# Patient Record
Sex: Male | Born: 1960 | Race: Black or African American | Hispanic: No | Marital: Married | Smoking: Former smoker
Health system: Southern US, Community
[De-identification: ages and names within clinical notes are randomized; demographics above are authoritative.]

## PROBLEM LIST (undated history)

## (undated) DIAGNOSIS — E119 Type 2 diabetes mellitus without complications: Secondary | ICD-10-CM

## (undated) HISTORY — PX: CHOLECYSTECTOMY: SHX55

---

## 2015-04-09 ENCOUNTER — Other Ambulatory Visit (HOSPITAL_COMMUNITY): Payer: Self-pay | Admitting: Internal Medicine

## 2015-04-09 DIAGNOSIS — E291 Testicular hypofunction: Secondary | ICD-10-CM

## 2015-04-24 ENCOUNTER — Ambulatory Visit (HOSPITAL_COMMUNITY)
Admission: RE | Admit: 2015-04-24 | Discharge: 2015-04-24 | Disposition: A | Discharge status: Home / Self Care | Payer: Self-pay | Source: Ambulatory Visit | Attending: Internal Medicine | Admitting: Internal Medicine

## 2015-04-24 ENCOUNTER — Other Ambulatory Visit (HOSPITAL_COMMUNITY): Payer: Self-pay | Admitting: Internal Medicine

## 2015-04-24 DIAGNOSIS — E291 Testicular hypofunction: Secondary | ICD-10-CM

## 2015-04-24 DIAGNOSIS — Z0389 Encounter for observation for other suspected diseases and conditions ruled out: Secondary | ICD-10-CM | POA: Insufficient documentation

## 2015-04-29 ENCOUNTER — Ambulatory Visit (HOSPITAL_COMMUNITY)
Admission: RE | Admit: 2015-04-29 | Discharge: 2015-04-29 | Disposition: A | Discharge status: Home / Self Care | Payer: BLUE CROSS/BLUE SHIELD | Source: Ambulatory Visit | Attending: Internal Medicine | Admitting: Internal Medicine

## 2015-04-29 ENCOUNTER — Other Ambulatory Visit (HOSPITAL_COMMUNITY): Payer: Self-pay | Admitting: Internal Medicine

## 2015-04-29 DIAGNOSIS — E291 Testicular hypofunction: Secondary | ICD-10-CM

## 2015-04-29 DIAGNOSIS — Q048 Other specified congenital malformations of brain: Secondary | ICD-10-CM | POA: Diagnosis not present

## 2015-04-29 MED ORDER — GADOBENATE DIMEGLUMINE 529 MG/ML IV SOLN
20.0000 mL | Freq: Once | INTRAVENOUS | Status: AC | PRN
  Administered 2015-04-29: 20 mL via INTRAVENOUS

## 2021-09-11 ENCOUNTER — Encounter (HOSPITAL_COMMUNITY): Payer: Self-pay

## 2021-09-11 ENCOUNTER — Emergency Department (HOSPITAL_COMMUNITY): Payer: BC Managed Care – PPO | Admitting: Anesthesiology

## 2021-09-11 ENCOUNTER — Encounter (HOSPITAL_COMMUNITY)
Admission: EM | Disposition: A | Discharge status: Home / Self Care | Payer: Self-pay | Source: Home / Self Care | Attending: Surgery

## 2021-09-11 ENCOUNTER — Inpatient Hospital Stay (HOSPITAL_COMMUNITY)
Admission: EM | Admit: 2021-09-11 | Discharge: 2021-09-15 | DRG: 339 | Disposition: A | Discharge status: Home / Self Care | Payer: BC Managed Care – PPO | Attending: Surgery | Admitting: Surgery

## 2021-09-11 ENCOUNTER — Other Ambulatory Visit: Payer: Self-pay

## 2021-09-11 ENCOUNTER — Emergency Department (HOSPITAL_COMMUNITY): Payer: BC Managed Care – PPO

## 2021-09-11 DIAGNOSIS — D649 Anemia, unspecified: Secondary | ICD-10-CM

## 2021-09-11 DIAGNOSIS — R17 Unspecified jaundice: Secondary | ICD-10-CM | POA: Diagnosis present

## 2021-09-11 DIAGNOSIS — D72829 Elevated white blood cell count, unspecified: Secondary | ICD-10-CM | POA: Diagnosis not present

## 2021-09-11 DIAGNOSIS — E876 Hypokalemia: Secondary | ICD-10-CM | POA: Diagnosis present

## 2021-09-11 DIAGNOSIS — Z833 Family history of diabetes mellitus: Secondary | ICD-10-CM

## 2021-09-11 DIAGNOSIS — E871 Hypo-osmolality and hyponatremia: Secondary | ICD-10-CM | POA: Diagnosis present

## 2021-09-11 DIAGNOSIS — Z20822 Contact with and (suspected) exposure to covid-19: Secondary | ICD-10-CM | POA: Diagnosis present

## 2021-09-11 DIAGNOSIS — E0800 Diabetes mellitus due to underlying condition with hyperosmolarity without nonketotic hyperglycemic-hyperosmolar coma (NKHHC): Principal | ICD-10-CM

## 2021-09-11 DIAGNOSIS — D62 Acute posthemorrhagic anemia: Secondary | ICD-10-CM | POA: Diagnosis not present

## 2021-09-11 DIAGNOSIS — Z6841 Body Mass Index (BMI) 40.0 and over, adult: Secondary | ICD-10-CM | POA: Diagnosis not present

## 2021-09-11 DIAGNOSIS — E1165 Type 2 diabetes mellitus with hyperglycemia: Secondary | ICD-10-CM | POA: Diagnosis not present

## 2021-09-11 DIAGNOSIS — Z87891 Personal history of nicotine dependence: Secondary | ICD-10-CM

## 2021-09-11 DIAGNOSIS — K352 Acute appendicitis with generalized peritonitis, without abscess: Secondary | ICD-10-CM | POA: Diagnosis present

## 2021-09-11 DIAGNOSIS — E119 Type 2 diabetes mellitus without complications: Secondary | ICD-10-CM | POA: Diagnosis present

## 2021-09-11 DIAGNOSIS — K3532 Acute appendicitis with perforation and localized peritonitis, without abscess: Principal | ICD-10-CM | POA: Diagnosis present

## 2021-09-11 DIAGNOSIS — K3580 Unspecified acute appendicitis: Secondary | ICD-10-CM | POA: Diagnosis present

## 2021-09-11 DIAGNOSIS — Z7984 Long term (current) use of oral hypoglycemic drugs: Secondary | ICD-10-CM

## 2021-09-11 DIAGNOSIS — K5900 Constipation, unspecified: Secondary | ICD-10-CM | POA: Diagnosis present

## 2021-09-11 HISTORY — PX: LAPAROSCOPIC APPENDECTOMY: SHX408

## 2021-09-11 LAB — COMPREHENSIVE METABOLIC PANEL
ALT: 24 U/L (ref 0–44)
AST: 17 U/L (ref 15–41)
Albumin: 3.5 g/dL (ref 3.5–5.0)
Alkaline Phosphatase: 97 U/L (ref 38–126)
Anion gap: 12 (ref 5–15)
BUN: 14 mg/dL (ref 8–23)
CO2: 27 mmol/L (ref 22–32)
Calcium: 9.9 mg/dL (ref 8.9–10.3)
Chloride: 91 mmol/L — ABNORMAL LOW (ref 98–111)
Creatinine, Ser: 0.82 mg/dL (ref 0.61–1.24)
GFR, Estimated: >60 mL/min (ref >60)
Glucose, Bld: 237 mg/dL — ABNORMAL HIGH (ref 70–99)
Potassium: 4.1 mmol/L (ref 3.5–5.1)
Sodium: 130 mmol/L — ABNORMAL LOW (ref 135–145)
Total Bilirubin: 1.4 mg/dL — ABNORMAL HIGH (ref 0.3–1.2)
Total Protein: 8 g/dL (ref 6.5–8.1)

## 2021-09-11 LAB — CBC
HCT: 46.9 % (ref 39.0–52.0)
Hemoglobin: 15.8 g/dL (ref 13.0–17.0)
MCH: 29 pg (ref 26.0–34.0)
MCHC: 33.7 g/dL (ref 30.0–36.0)
MCV: 86.2 fL (ref 80.0–100.0)
Platelets: 257 10*3/uL (ref 150–400)
RBC: 5.44 MIL/uL (ref 4.22–5.81)
RDW: 12.6 % (ref 11.5–15.5)
WBC: 24.5 10*3/uL — ABNORMAL HIGH (ref 4.0–10.5)
nRBC: 0 % (ref 0.0–0.2)

## 2021-09-11 LAB — URINALYSIS, ROUTINE W REFLEX MICROSCOPIC
Bilirubin Urine: NEGATIVE
Glucose, UA: NEGATIVE mg/dL
Ketones, ur: 5 mg/dL — AB
Leukocytes,Ua: NEGATIVE
Nitrite: NEGATIVE
Protein, ur: 100 mg/dL — AB
Specific Gravity, Urine: >1.046 — ABNORMAL HIGH (ref 1.005–1.030)
pH: 5 (ref 5.0–8.0)

## 2021-09-11 LAB — RESP PANEL BY RT-PCR (FLU A&B, COVID) ARPGX2
Influenza A by PCR: NEGATIVE
Influenza B by PCR: NEGATIVE
SARS Coronavirus 2 by RT PCR: NEGATIVE

## 2021-09-11 LAB — GLUCOSE, CAPILLARY
Glucose-Capillary: 274 mg/dL — ABNORMAL HIGH (ref 70–99)
Glucose-Capillary: 225 mg/dL — ABNORMAL HIGH (ref 70–99)

## 2021-09-11 LAB — LIPASE, BLOOD: Lipase: 41 U/L (ref 11–51)

## 2021-09-11 SURGERY — APPENDECTOMY, LAPAROSCOPIC
Anesthesia: General | Site: Abdomen

## 2021-09-11 MED ORDER — ESMOLOL HCL 100 MG/10ML IV SOLN
INTRAVENOUS | Status: DC | PRN
  Administered 2021-09-11: 20 mg via INTRAVENOUS

## 2021-09-11 MED ORDER — ONDANSETRON HCL 4 MG/2ML IJ SOLN
INTRAMUSCULAR | Status: AC
  Filled 2021-09-11: qty 2

## 2021-09-11 MED ORDER — KETOROLAC TROMETHAMINE 30 MG/ML IJ SOLN
INTRAMUSCULAR | Status: DC | PRN
Start: 2021-09-11 — End: 2021-09-11
  Administered 2021-09-11: 30 mg via INTRAVENOUS

## 2021-09-11 MED ORDER — IOHEXOL 300 MG/ML  SOLN
100.0000 mL | Freq: Once | INTRAMUSCULAR | Status: AC | PRN
  Administered 2021-09-11: 100 mL via INTRAVENOUS

## 2021-09-11 MED ORDER — ONDANSETRON 4 MG PO TBDP: 4.0000 mg | ORAL_TABLET | Freq: Four times a day (QID) | ORAL | Status: DC | PRN

## 2021-09-11 MED ORDER — ACETAMINOPHEN 10 MG/ML IV SOLN
INTRAVENOUS | Status: DC | PRN
  Administered 2021-09-11: 1000 mg via INTRAVENOUS

## 2021-09-11 MED ORDER — CHLORHEXIDINE GLUCONATE CLOTH 2 % EX PADS: 6.0000 | MEDICATED_PAD | Freq: Once | CUTANEOUS | Status: DC

## 2021-09-11 MED ORDER — MIDAZOLAM HCL 2 MG/2ML IJ SOLN
INTRAMUSCULAR | Status: DC | PRN
Start: 2021-09-11 — End: 2021-09-11
  Administered 2021-09-11: 2 mg via INTRAVENOUS

## 2021-09-11 MED ORDER — PROPOFOL 10 MG/ML IV BOLUS
INTRAVENOUS | Status: AC
  Filled 2021-09-11: qty 20

## 2021-09-11 MED ORDER — MIDAZOLAM HCL 2 MG/2ML IJ SOLN
INTRAMUSCULAR | Status: AC
  Filled 2021-09-11: qty 2

## 2021-09-11 MED ORDER — ACETAMINOPHEN 500 MG PO TABS
1000.0000 mg | ORAL_TABLET | Freq: Four times a day (QID) | ORAL | Status: DC
  Administered 2021-09-11 – 2021-09-15 (×16): 1000 mg via ORAL
  Filled 2021-09-11 (×16): qty 2

## 2021-09-11 MED ORDER — KETOROLAC TROMETHAMINE 30 MG/ML IJ SOLN
INTRAMUSCULAR | Status: AC
  Filled 2021-09-11: qty 1

## 2021-09-11 MED ORDER — HYDROMORPHONE HCL 1 MG/ML IJ SOLN
0.5000 mg | INTRAMUSCULAR | Status: DC | PRN
  Administered 2021-09-12 (×2): 0.5 mg via INTRAVENOUS
  Filled 2021-09-11 (×2): qty 0.5

## 2021-09-11 MED ORDER — MORPHINE SULFATE (PF) 4 MG/ML IV SOLN
4.0000 mg | Freq: Once | INTRAVENOUS | Status: AC
  Administered 2021-09-11: 4 mg via INTRAVENOUS
  Filled 2021-09-11: qty 1

## 2021-09-11 MED ORDER — HYDROMORPHONE HCL 1 MG/ML IJ SOLN: 0.2500 mg | INTRAMUSCULAR | Status: DC | PRN

## 2021-09-11 MED ORDER — ACETAMINOPHEN 10 MG/ML IV SOLN
INTRAVENOUS | Status: AC
  Filled 2021-09-11: qty 100

## 2021-09-11 MED ORDER — ROCURONIUM BROMIDE 10 MG/ML (PF) SYRINGE
PREFILLED_SYRINGE | INTRAVENOUS | Status: AC
  Filled 2021-09-11: qty 10

## 2021-09-11 MED ORDER — ONDANSETRON HCL 4 MG/2ML IJ SOLN: 4.0000 mg | Freq: Four times a day (QID) | INTRAMUSCULAR | Status: DC | PRN

## 2021-09-11 MED ORDER — SUCCINYLCHOLINE CHLORIDE 200 MG/10ML IV SOSY
PREFILLED_SYRINGE | INTRAVENOUS | Status: AC
  Filled 2021-09-11: qty 10

## 2021-09-11 MED ORDER — FENTANYL CITRATE (PF) 250 MCG/5ML IJ SOLN
INTRAMUSCULAR | Status: DC | PRN
Start: 2021-09-11 — End: 2021-09-11
  Administered 2021-09-11 (×2): 100 ug via INTRAVENOUS
  Administered 2021-09-11: 50 ug via INTRAVENOUS

## 2021-09-11 MED ORDER — ONDANSETRON HCL 4 MG/2ML IJ SOLN
INTRAMUSCULAR | Status: DC | PRN
Start: 2021-09-11 — End: 2021-09-11
  Administered 2021-09-11: 4 mg via INTRAVENOUS

## 2021-09-11 MED ORDER — SODIUM CHLORIDE 0.9 % IV BOLUS
1000.0000 mL | Freq: Once | INTRAVENOUS | Status: AC
  Administered 2021-09-11: 1000 mL via INTRAVENOUS

## 2021-09-11 MED ORDER — PIPERACILLIN-TAZOBACTAM 3.375 G IVPB 30 MIN
3.3750 g | Freq: Once | INTRAVENOUS | Status: AC
  Administered 2021-09-11: 3.375 g via INTRAVENOUS
  Filled 2021-09-11: qty 50

## 2021-09-11 MED ORDER — INSULIN ASPART 100 UNIT/ML IJ SOLN
0.0000 [IU] | Freq: Three times a day (TID) | INTRAMUSCULAR | Status: DC
  Administered 2021-09-11: 8 [IU] via SUBCUTANEOUS
  Administered 2021-09-12 (×2): 2 [IU] via SUBCUTANEOUS
  Administered 2021-09-12: 3 [IU] via SUBCUTANEOUS
  Administered 2021-09-13: 2 [IU] via SUBCUTANEOUS
  Administered 2021-09-13: 3 [IU] via SUBCUTANEOUS
  Administered 2021-09-13: 2 [IU] via SUBCUTANEOUS
  Administered 2021-09-14 (×2): 3 [IU] via SUBCUTANEOUS
  Administered 2021-09-15 (×2): 2 [IU] via SUBCUTANEOUS

## 2021-09-11 MED ORDER — BUPIVACAINE LIPOSOME 1.3 % IJ SUSP
INTRAMUSCULAR | Status: AC
  Filled 2021-09-11: qty 20

## 2021-09-11 MED ORDER — METOCLOPRAMIDE HCL 5 MG/ML IJ SOLN
INTRAMUSCULAR | Status: DC | PRN
  Administered 2021-09-11: 10 mg via INTRAVENOUS

## 2021-09-11 MED ORDER — ESMOLOL HCL 100 MG/10ML IV SOLN
INTRAVENOUS | Status: AC
  Filled 2021-09-11: qty 10

## 2021-09-11 MED ORDER — ROCURONIUM BROMIDE 10 MG/ML (PF) SYRINGE
PREFILLED_SYRINGE | INTRAVENOUS | Status: DC | PRN
Start: 2021-09-11 — End: 2021-09-11
  Administered 2021-09-11: 20 mg via INTRAVENOUS
  Administered 2021-09-11: 100 mg via INTRAVENOUS

## 2021-09-11 MED ORDER — CHLORHEXIDINE GLUCONATE 0.12 % MT SOLN
15.0000 mL | Freq: Once | OROMUCOSAL | Status: AC
  Administered 2021-09-11: 15 mL via OROMUCOSAL

## 2021-09-11 MED ORDER — DEXAMETHASONE SODIUM PHOSPHATE 10 MG/ML IJ SOLN
INTRAMUSCULAR | Status: AC
  Filled 2021-09-11: qty 1

## 2021-09-11 MED ORDER — LIDOCAINE HCL (PF) 2 % IJ SOLN
INTRAMUSCULAR | Status: AC
  Filled 2021-09-11: qty 5

## 2021-09-11 MED ORDER — SUCCINYLCHOLINE CHLORIDE 200 MG/10ML IV SOSY
PREFILLED_SYRINGE | INTRAVENOUS | Status: DC | PRN
Start: 2021-09-11 — End: 2021-09-11
  Administered 2021-09-11: 120 mg via INTRAVENOUS

## 2021-09-11 MED ORDER — DEXAMETHASONE SODIUM PHOSPHATE 4 MG/ML IJ SOLN
INTRAMUSCULAR | Status: DC | PRN
Start: 2021-09-11 — End: 2021-09-11
  Administered 2021-09-11 (×2): 5 mg via INTRAVENOUS

## 2021-09-11 MED ORDER — PROPOFOL 10 MG/ML IV BOLUS
INTRAVENOUS | Status: DC | PRN
  Administered 2021-09-11: 200 mg via INTRAVENOUS
  Administered 2021-09-11: 30 mg via INTRAVENOUS

## 2021-09-11 MED ORDER — LACTATED RINGERS IV SOLN: INTRAVENOUS | Status: DC

## 2021-09-11 MED ORDER — OXYCODONE HCL 5 MG PO TABS: 5.0000 mg | ORAL_TABLET | ORAL | Status: DC | PRN

## 2021-09-11 MED ORDER — ENOXAPARIN SODIUM 40 MG/0.4ML IJ SOSY
40.0000 mg | PREFILLED_SYRINGE | INTRAMUSCULAR | Status: DC
  Administered 2021-09-11 – 2021-09-14 (×4): 40 mg via SUBCUTANEOUS
  Filled 2021-09-11 (×4): qty 0.4

## 2021-09-11 MED ORDER — PIPERACILLIN-TAZOBACTAM 3.375 G IVPB
3.3750 g | Freq: Three times a day (TID) | INTRAVENOUS | Status: DC
  Administered 2021-09-11 – 2021-09-15 (×12): 3.375 g via INTRAVENOUS
  Filled 2021-09-11 (×11): qty 50

## 2021-09-11 MED ORDER — SODIUM CHLORIDE 0.9 % IV SOLN: INTRAVENOUS | Status: DC

## 2021-09-11 MED ORDER — SODIUM CHLORIDE 0.9 % IR SOLN
Status: DC | PRN
  Administered 2021-09-11: 3000 mL

## 2021-09-11 MED ORDER — PROPOFOL 10 MG/ML IV BOLUS
INTRAVENOUS | Status: AC
Start: 2021-09-11 — End: ?
  Filled 2021-09-11: qty 20

## 2021-09-11 MED ORDER — METOCLOPRAMIDE HCL 5 MG/ML IJ SOLN
INTRAMUSCULAR | Status: AC
  Filled 2021-09-11: qty 2

## 2021-09-11 MED ORDER — ONDANSETRON HCL 4 MG/2ML IJ SOLN
4.0000 mg | Freq: Once | INTRAMUSCULAR | Status: AC
  Administered 2021-09-11: 4 mg via INTRAVENOUS
  Filled 2021-09-11: qty 2

## 2021-09-11 MED ORDER — SUGAMMADEX SODIUM 200 MG/2ML IV SOLN
INTRAVENOUS | Status: DC | PRN
  Administered 2021-09-11: 400 mg via INTRAVENOUS

## 2021-09-11 MED ORDER — SODIUM CHLORIDE 0.9 % IR SOLN
Status: DC | PRN
  Administered 2021-09-11: 1000 mL

## 2021-09-11 MED ORDER — PHENYLEPHRINE HCL (PRESSORS) 10 MG/ML IV SOLN
INTRAVENOUS | Status: DC | PRN
  Administered 2021-09-11: 40 ug via INTRAVENOUS
  Administered 2021-09-11: 200 ug via INTRAVENOUS
  Administered 2021-09-11: 160 ug via INTRAVENOUS
  Administered 2021-09-11 (×2): 200 ug via INTRAVENOUS

## 2021-09-11 MED ORDER — ORAL CARE MOUTH RINSE: 15.0000 mL | Freq: Once | OROMUCOSAL | Status: AC

## 2021-09-11 MED ORDER — MEPERIDINE HCL 50 MG/ML IJ SOLN: 6.2500 mg | INTRAMUSCULAR | Status: DC | PRN

## 2021-09-11 MED ORDER — BUPIVACAINE LIPOSOME 1.3 % IJ SUSP
INTRAMUSCULAR | Status: DC | PRN
  Administered 2021-09-11: 20 mL

## 2021-09-11 MED ORDER — FENTANYL CITRATE (PF) 250 MCG/5ML IJ SOLN
INTRAMUSCULAR | Status: AC
  Filled 2021-09-11: qty 5

## 2021-09-11 MED ORDER — LIDOCAINE 2% (20 MG/ML) 5 ML SYRINGE
INTRAMUSCULAR | Status: DC | PRN
  Administered 2021-09-11: 100 mg via INTRAVENOUS

## 2021-09-11 SURGICAL SUPPLY — 52 items
BAG RETRIEVAL 10 (BASKET) ×1
BLADE SURG 15 STRL LF DISP TIS (BLADE) ×1 IMPLANT
BLADE SURG 15 STRL SS (BLADE) ×1
CHLORAPREP W/TINT 26 (MISCELLANEOUS) ×2 IMPLANT
CLOTH BEACON ORANGE TIMEOUT ST (SAFETY) ×2 IMPLANT
COVER LIGHT HANDLE STERIS (MISCELLANEOUS) ×4 IMPLANT
CUTTER FLEX LINEAR 45M (STAPLE) ×2 IMPLANT
DERMABOND ADVANCED (GAUZE/BANDAGES/DRESSINGS) ×1
DERMABOND ADVANCED .7 DNX12 (GAUZE/BANDAGES/DRESSINGS) ×1 IMPLANT
ELECT REM PT RETURN 9FT ADLT (ELECTROSURGICAL) ×2
ELECTRODE REM PT RTRN 9FT ADLT (ELECTROSURGICAL) ×1 IMPLANT
GAUZE 4X4 16PLY ~~LOC~~+RFID DBL (SPONGE) ×1 IMPLANT
GLOVE SURG ENC MOIS LTX SZ6.5 (GLOVE) ×2 IMPLANT
GLOVE SURG LTX SZ6.5 (GLOVE) ×2 IMPLANT
GLOVE SURG POLYISO LF SZ7 (GLOVE) ×1 IMPLANT
GLOVE SURG UNDER POLY LF SZ6.5 (GLOVE) ×1 IMPLANT
GLOVE SURG UNDER POLY LF SZ7 (GLOVE) ×8 IMPLANT
GOWN STRL REUS W/ TWL LRG LVL3 (GOWN DISPOSABLE) IMPLANT
GOWN STRL REUS W/TWL LRG LVL3 (GOWN DISPOSABLE) ×5 IMPLANT
GRASPER SUT TROCAR 14GX15 (MISCELLANEOUS) ×1 IMPLANT
INST SET LAPROSCOPIC AP (KITS) ×2 IMPLANT
IV NS IRRIG 3000ML ARTHROMATIC (IV SOLUTION) ×1 IMPLANT
KIT TURNOVER KIT A (KITS) ×2 IMPLANT
MANIFOLD NEPTUNE II (INSTRUMENTS) ×2 IMPLANT
NDL HYPO 21X1.5 SAFETY (NEEDLE) ×1 IMPLANT
NDL INSUFFLATION 14GA 120MM (NEEDLE) ×1 IMPLANT
NEEDLE HYPO 21X1.5 SAFETY (NEEDLE) ×2 IMPLANT
NEEDLE INSUFFLATION 14GA 120MM (NEEDLE) ×2 IMPLANT
NS IRRIG 1000ML POUR BTL (IV SOLUTION) ×2 IMPLANT
PACK LAP CHOLE LZT030E (CUSTOM PROCEDURE TRAY) ×2 IMPLANT
PAD ARMBOARD 7.5X6 YLW CONV (MISCELLANEOUS) ×2 IMPLANT
PENCIL HANDSWITCHING (ELECTRODE) ×1 IMPLANT
RELOAD STAPLE 45 3.5 BLU ETS (ENDOMECHANICALS) IMPLANT
RELOAD STAPLE TA45 3.5 REG BLU (ENDOMECHANICALS) ×2 IMPLANT
SET BASIN LINEN APH (SET/KITS/TRAYS/PACK) ×2 IMPLANT
SET TUBE IRRIG SUCTION NO TIP (IRRIGATION / IRRIGATOR) ×1 IMPLANT
SET TUBE SMOKE EVAC HIGH FLOW (TUBING) ×2 IMPLANT
SHEARS HARMONIC ACE PLUS 36CM (ENDOMECHANICALS) ×2 IMPLANT
SLEEVE Z-THREAD 5X100MM (TROCAR) ×1 IMPLANT
SUT MNCRL AB 4-0 PS2 18 (SUTURE) ×4 IMPLANT
SUT VICRYL 0 UR6 27IN ABS (SUTURE) ×3 IMPLANT
SYR 20ML LL LF (SYRINGE) ×3 IMPLANT
SYS BAG RETRIEVAL 10MM (BASKET) ×1
SYSTEM BAG RETRIEVAL 10MM (BASKET) ×1 IMPLANT
TRAY FOLEY W/BAG SLVR 16FR (SET/KITS/TRAYS/PACK) ×1
TRAY FOLEY W/BAG SLVR 16FR ST (SET/KITS/TRAYS/PACK) ×1 IMPLANT
TROCAR ENDO BLADELESS 11MM (ENDOMECHANICALS) ×2 IMPLANT
TROCAR ENDO BLADELESS 12MM (ENDOMECHANICALS) ×2 IMPLANT
TROCAR XCEL UNIV SLVE 11M 100M (ENDOMECHANICALS) ×1 IMPLANT
TROCAR Z-THREAD FIOS 5X100MM (TROCAR) ×1 IMPLANT
TUBE CONNECTING 12X1/4 (SUCTIONS) ×2 IMPLANT
WARMER LAPAROSCOPE (MISCELLANEOUS) ×2 IMPLANT

## 2021-09-11 NOTE — ED Notes (Signed)
Patient transported to OR.

## 2021-09-11 NOTE — ED Provider Notes (Signed)
?[facility] DEPART[facility] ?Provider Note ? ? ?CSN: [id number] ?Arrival date & time: 09/11/21  0932 ? ?  ? ?History ? ?Chief Complaint  ?Patient presents with  ? Abdominal Pain  ? ? ?Angel Berry is a 61 y.o. male. ? ?HPI ?Patient presents with his wife who assists with the history.  Patient has difficulty with hearing.  He notes that he was generally well until yesterday when he developed diffuse abdominal pain across the lower abdomen.  Since that time he has had frequency of stool, episodes of vomiting, and no relief with OTC medication.  He has a history of prior cholecystectomy, but still has his appendix.  He has no history of kidney stones. ?  ? ?Home Medications ?Prior to Admission medications   ?Not on File  ?   ? ?Allergies    ?Patient has no known allergies.   ? ?Review of Systems   ?Review of Systems  ?Constitutional:   ?     Per HPI, otherwise negative  ?HENT:    ?     Per HPI, otherwise negative  ?Respiratory:    ?     Per HPI, otherwise negative  ?Cardiovascular:   ?     Per HPI, otherwise negative  ?Gastrointestinal:  Positive for abdominal pain, diarrhea, nausea and vomiting.  ?Endocrine:  ?     Negative aside from HPI  ?Genitourinary:   ?     Neg aside from HPI   ?Musculoskeletal:   ?     Per HPI, otherwise negative  ?Skin: Negative.   ?Neurological:  Negative for syncope.  ? ?Physical Exam ?Updated Vital Signs ?BP 136/74   Pulse (!) 101   Resp 20   Ht 5\' 7"  (1.702 m)   Wt 120.2 kg   SpO2 97%   BMI 41.50 kg/m?  ?Physical Exam ?Vitals and nursing note reviewed.  ?Constitutional:   ?   General: He is not in acute distress. ?   Appearance: He is well-developed. He is obese.  ?HENT:  ?   Head: Normocephalic and atraumatic.  ?Eyes:  ?   Conjunctiva/sclera: Conjunctivae normal.  ?Cardiovascular:  ?   Rate and Rhythm: Regular rhythm. Tachycardia present.  ?Pulmonary:  ?   Effort: Pulmonary effort is normal. No respiratory distress.  ?   Breath sounds: No stridor.  ?Abdominal:  ?   General:  There is no distension.  ?   Tenderness: There is abdominal tenderness in the right lower quadrant, suprapubic area and left lower quadrant. There is guarding.  ?Skin: ?   General: Skin is warm and dry.  ?Neurological:  ?   Mental Status: He is alert and oriented to person, place, and time.  ? ? ?ED Results / Procedures / Treatments   ?Labs ?(all labs ordered are listed, but only abnormal results are displayed) ?Labs Reviewed  ?CBC - Abnormal; Notable for the following components:  ?    Result Value  ? WBC 24.5 (*)   ? All other components within normal limits  ?LIPASE, BLOOD  ?COMPREHENSIVE METABOLIC PANEL  ?URINALYSIS, ROUTINE W REFLEX MICROSCOPIC  ? ? ?EKG ?None ? ?Radiology ?No results found. ? ?Procedures ?Procedures  ? ? ?Medications Ordered in ED ?Medications  ?ondansetron (ZOFRAN) injection 4 mg (has no administration in time range)  ?morphine (PF) 4 MG/ML injection 4 mg (has no administration in time range)  ?sodium chloride 0.9 % bolus 1,000 mL (has no administration in time range)  ?  And  ?0.9 %  sodium chloride infusion (has no administration in time range)  ? ? ?ED Course/ Medical Decision Making/ A&P ?This patient with a Hx of prior cholecystectomy presents to the ED for concern of abdominal pain, nausea, vomiting, diarrhea, this involves an extensive number of treatment options, and is a complaint that carries with it a high risk of complications and morbidity.   ? ?The differential diagnosis includes diverticulitis, intra-abdominal abscesses, appendicitis, bacteremia, sepsis, less likely kidney stone ? ? ?Social Determinants of Health: ? ?Obesity, age ? ?Additional history obtained: ? ?Additional history and/or information obtained from wife, notable for HPI details as above ? ? ?After the initial evaluation, orders, including: Labs, CT, IV narcotic analgesia, antiemetics were initiated. ? ? ?Patient placed on Cardiac and Pulse-Oximetry Monitors. ?The patient was maintained on a cardiac monitor.   The cardiac monitored showed an rhythm of sinus tach 105 abnormal ?The patient was also maintained on pulse oximetry. The readings were typically 100% room air normal ? ? ?On repeat evaluation of the patient stayed the same ? ?Lab Tests: ? ?I personally interpreted labs.  The pertinent results include: Substantial leukocytosis greater than 20,000 ? ?Imaging Studies ordered: ? ?I independently visualized and interpreted imaging which showed appendicitis, substantial surrounding inflammation, questionable perforation ?I agree with the radiologist interpretation ? ?Consultations Obtained: ? ?I requested consultation with the general surgery,  and discussed lab and imaging findings as well as pertinent plan - they recommend: IV antibiotics, admission for appendectomy ? ?Dispostion / Final MDM: ? ?After consideration of the diagnostic results and the patient's response to treatment, patient will be admitted.  This adult male presents with almost 2 days of abdominal pain, nausea, vomiting, has peritonitis on physical exam, there is immediate concern for intra-abdominal processes such as appendicitis versus diverticulitis plus minus abscess.  Patient CT scan demonstrated acute appendicitis, with possible rupture.  Case discussed at bedside with our general surgeon, patient admitted after initiation of antibiotics as above. ? ?Final Clinical Impression(s) / ED Diagnoses ?Final diagnoses:  ?Acute appendicitis with generalized peritonitis, without gangrene or abscess, unspecified whether perforation present  ? ?  ?Gerhard Munch, MD ?09/11/21 1251 ? ?

## 2021-09-11 NOTE — ED Triage Notes (Signed)
Patient with complaints of abdominal pain, with nausea, vomiting , and diarrhea for several days.  ?

## 2021-09-11 NOTE — Op Note (Signed)
[facility] ? ?Date of Surgery: 09/11/2021  ?Admit Date: 09/11/2021  ? ?Performing Service: General  ?Surgeon(s) and Role: ?   * Hannahmarie Asberry, Gustavus Messing, DO - Primary  ? ?Pre-operative Diagnosis: Acute Appendicitis ? ?Post-operative Diagnosis: Acute gangrenous and perforated appendicitis ? ?Procedure Performed: Laparoscopic Appendectomy  ? ?Surgeon: Theophilus Kinds, DO  ? ?Assistant: No qualified resident was available.  ? ?Anesthesia: General  ? ?Findings:  ?The appendix was found to be inflamed. There were signs of necrosis. There was perforation. There was not abscess formation.  ? ?Estimated Blood Loss: Minimal  ? ?Specimens:  ?ID Type Source Tests Collected by Time Destination  ?1 : Appendix Tissue PATH Appendix SURGICAL PATHOLOGY [name], Gustavus Messing, DO 09/11/2021 1447   ?  ? ?Complications: None; patient tolerated the procedure well.  ? ?Disposition: PACU - hemodynamically stable.  ? ?Condition: stable  ? ?Indications: The patient presented with a 2 day history of right-sided abdominal pain. A CT abdomen and pelvis revealed findings consistent with acute appendicitis, possible perforation.  Decision was made to take the patient to the operating room. ? ?All risks, benefits, and alternatives to Laparoscopic appendectomy were discussed with the patient and his family, all of their questions were answered to their expressed satisfaction. The patient expresses he wishes to proceed, and informed consent was obtained. ?  ? ?Procedure Details  ?Prior to the procedure, the risks, benefits, complications, treatment options, and expected outcomes were discussed with the patient and/or family, including but not limited to the risk of bleeding, infection, finding of a normal appendix, and the need for conversion to an open procedure. There was concurrence with the proposed plan and informed consent was obtained. The patient was taken to the operating room, identified as Angel Berry and the  procedure verified as Laproscopic Appendectomy.  ? ?The patient was placed in the supine position and general anesthesia was induced, along with placement of orogastric tube, SCD's, and a Foley catheter. The abdomen was prepped and draped in a sterile fashion. The abdomen was entered with Veress needle, but patient had high pressures with insufflation, that decision was made to enter the abdomen via Hassan technique.  The fascia and peritoneum were raised and incised, and the abdomen was entered. An 11 mm optiview trocar was placed under direct visualization with a 0 degree scope. The 10 mm 0 degree scope was placed in the abdomen and no evidence of injury was identified. A 12 mm port was placed in the left lower quadrant of the abdomen after skin incision with trocar placement under direct vision. A careful evaluation of the entire abdomen was carried out. An additional 5 mm port was placed in the suprapubic area under direct vision.  The patient was placed in Trendelenburg and left lateral decubitus position. The small intestines were retracted in the cephalad and left lateral direction away from the pelvis and right lower quadrant. The patient was found to have a gangrenous appendix. There was evidence of perforation with murky fluid surrounding the appendix. ? ?The appendix was carefully dissected. An additional 5 mm trocar was placed in the RUQ to assist with retraction.  The cecum was dissected free from the abdominal wall by taking down the white line of Toldt. The mesoappendix was taken with the harmonic energy device. The appendix was divided at its base using another tan endo-GIA stapler. Minimal appendiceal stump was left in place. The appendix was placed within an Endocatch specimen bag. There was no evidence of bleeding, leakage, or complication  after division of the appendix. ? ?Any remaining blood or pus was suctioned out from the abdomen, hemostasis was confirmed. The abdomen was then irrigated with  warm saline. The endocatch bag was removed via the 11 mm port.  Given the thick abdominal wall, a transfascial stitch of 0 Vicryl in a figure of eight fashion was used to close the 12 mm port site. The abdomen was desufflated. The appendix was passed off the field as a specimen.  ? ?The 10 mm port site was closed with a 0 Vicryl suture. The trocar site skin wounds were closed using subcuticular 4-0 Monocryl suture and dermabond. The patient was then awakened from general anesthesia, extubated, and taken to PACU for recovery.  ? ?Instrument, sponge, and needle counts were correct at the conclusion of the case.  ? ?Theophilus Kinds, DO ?[facility] ?[address] ?[place], [place] [address] ?[phone]([id number]) ? ? ? ? ?

## 2021-09-11 NOTE — Anesthesia Preprocedure Evaluation (Signed)
Anesthesia Evaluation  ?Patient identified by MRN, date of birth, ID band ?Patient awake ? ? ? ?Reviewed: ?Allergy & Precautions, NPO status , Patient's Chart, lab work & pertinent test results ? ?Airway ?Mallampati: III ? ?TM Distance: >3 FB ?Neck ROM: Full ? ? ? Dental ? ?(+) Dental Advisory Given, Missing ?  ?Pulmonary ?former smoker,  ?  ?Pulmonary exam normal ?breath sounds clear to auscultation ? ? ? ? ? ? Cardiovascular ?negative cardio ROS ? ? ?Rhythm:Regular Rate:Tachycardia ? ? ?  ?Neuro/Psych ?negative neurological ROS ? negative psych ROS  ? GI/Hepatic ?Neg liver ROS, GERD (mild)  ,  ?Endo/Other  ?Morbid obesity ? Renal/GU ?negative Renal ROS  ?negative genitourinary ?  ?Musculoskeletal ?negative musculoskeletal ROS ?(+)  ? Abdominal ?  ?Peds ?negative pediatric ROS ?(+)  Hematology ?negative hematology ROS ?(+)   ?Anesthesia Other Findings ?Room air saturations 88-89% ? Reproductive/Obstetrics ?negative OB ROS ? ?  ? ? ? ? ? ? ? ? ? ? ? ? ? ?  ?  ? ? ? ? ? ? ? ?Anesthesia Physical ?Anesthesia Plan ? ?ASA: 3 and emergent ? ?Anesthesia Plan: General  ? ?Post-op Pain Management: Dilaudid IV  ? ?Induction: Intravenous ? ?PONV Risk Score and Plan: 4 or greater and Ondansetron and Dexamethasone ? ?Airway Management Planned: Oral ETT ? ?Additional Equipment:  ? ?Intra-op Plan:  ? ?Post-operative Plan: Extubation in OR ? ?Informed Consent: I have reviewed the patients History and Physical, chart, labs and discussed the procedure including the risks, benefits and alternatives for the proposed anesthesia with the patient or authorized representative who has indicated his/her understanding and acceptance.  ? ? ? ?Dental advisory given ? ?Plan Discussed with: CRNA and Surgeon ? ?Anesthesia Plan Comments:   ? ? ? ? ? ?Anesthesia Quick Evaluation ? ?

## 2021-09-11 NOTE — ED Notes (Signed)
Consent signed for appendectomy  ?

## 2021-09-11 NOTE — Anesthesia Procedure Notes (Signed)
Procedure Name: Intubation ?Date/Time: 09/11/2021 1:44 PM ?Performed by: Cy Blamer, CRNA ?Pre-anesthesia Checklist: Patient identified, Emergency Drugs available, Suction available, Patient being monitored and Timeout performed ?Patient Re-evaluated:Patient Re-evaluated prior to induction ?Oxygen Delivery Method: Circle system utilized ?Preoxygenation: Pre-oxygenation with 100% oxygen ?Induction Type: IV induction, Rapid sequence and Cricoid Pressure applied ?Laryngoscope Size: Glidescope and 3 ?Grade View: Grade I ?Tube type: Oral ?Tube size: 7.5 mm ?Number of attempts: 1 ?Airway Equipment and Method: Stylet, Oral airway and Video-laryngoscopy ?Placement Confirmation: ETT inserted through vocal cords under direct vision, positive ETCO2 and breath sounds checked- equal and bilateral ?Secured at: 23 cm ?Tube secured with: Tape ?Dental Injury: Teeth and Oropharynx as per pre-operative assessment  ? ? ? ? ?

## 2021-09-11 NOTE — Transfer of Care (Signed)
Immediate Anesthesia Transfer of Care Note ? ?Patient: Angel Berry ? ?Procedure(s) Performed: APPENDECTOMY LAPAROSCOPIC (Abdomen) ? ?Patient Location: PACU ? ?Anesthesia Type:General ? ?Level of Consciousness: awake, drowsy and patient cooperative ? ?Airway & Oxygen Therapy: Patient Spontanous Breathing and Patient connected to nasal cannula oxygen ? ?Post-op Assessment: Report given to RN, Post -op Vital signs reviewed and stable and Patient moving all extremities X 4 ? ?Post vital signs: Reviewed and stable ? ?Last Vitals:  ?Vitals Value Taken Time  ?BP    ?Temp    ?Pulse 102 09/11/21 1636  ?Resp 20 09/11/21 1637  ?SpO2 94 % 09/11/21 1636  ?Vitals shown include unvalidated device data. ? ?Last Pain:  ?Vitals:  ? 09/11/21 1302  ?TempSrc: Oral  ?PainSc: 5   ?   ? ?  ? ?Complications: No notable events documented. ?

## 2021-09-11 NOTE — H&P (Signed)
[facility] History and Physical ? ?Reason for Referral: Acute appendicitis ?Referring Physician: Gerhard Munchobert Lockwood, MD ? ?Chief Complaint   ?Abdominal Pain ?  ? ? ?Angel MountGregory Berry is a 61 y.o. male.  ?HPI: Patient presents for a 2 day history of abdominal pain.  He states that on Tuesday, he started having periumbilical pain.  He confirms feeling chilled with nausea and episodes of emesis yesterday.  His pain continued to progress yesterday, and then this morning, he started having sharp pains in the right lower quadrant, which prompted him to come to the emergency department.  He has never had pain like this before.  Patient denies any significant past medical history.  He only takes ibuprofen as needed for pain.  He denies use of blood thinning medications.  His surgical history significant for an open cholecystectomy in 1989.  He quit smoking in 1996, denies illicit drug use, and only intermittently drinks alcohol. ? ?In the ED, he underwent a CT abdomen and pelvis which demonstrates acute appendicitis with some free fluid noted, possibly concerning for perforation.  WBC 24.5. ? ?No past medical history on file. ? ?No family history on file. ? ?Social History  ? ?Tobacco Use  ? Smoking status: Former  ?  Types: Cigarettes  ?Substance Use Topics  ? Alcohol use: Yes  ? Drug use: Not Currently  ? ? ?Medications: I have reviewed the patient's current medications. ?Allergies as of 09/11/2021   ?No Known Allergies ?  ? ? ?ROS:  ?Constitutional: positive for chills and fatigue, negative for fevers ?Respiratory: negative for cough, wheezing, and shortness of breath ?Cardiovascular: negative for chest pain and palpitations ?Gastrointestinal: positive for abdominal pain, nausea, and vomiting ?Musculoskeletal:negative for back pain ? ?Blood pressure 118/66, pulse 96, temperature 97.9 ?F (36.6 ?C), temperature source Oral, resp. rate 16, height 5\' 7"  (1.702 m), weight 120.2 kg, SpO2 93 %. ?Physical  Exam ?Vitals reviewed.  ?Constitutional:   ?   Appearance: He is well-developed.  ?HENT:  ?   Head: Normocephalic and atraumatic.  ?Cardiovascular:  ?   Rate and Rhythm: Normal rate.  ?Pulmonary:  ?   Effort: Pulmonary effort is normal.  ?Abdominal:  ?   Comments: Abdomen soft, nondistended, no percussion tenderness, tenderness to palpation in bilateral lower quadrants, worse on the right; no rigidity, guarding, rebound tenderness; large cicatrix noted in right upper quadrant; positive McBurney's and Rovsing signs  ?Skin: ?   General: Skin is warm and dry.  ?Neurological:  ?   General: No focal deficit present.  ?   Mental Status: He is alert and oriented to person, place, and time.  ?Psychiatric:     ?   Mood and Affect: Mood normal.     ?   Behavior: Behavior normal.  ? ? ?Results: Imaging evaluated by myself ?Results for orders placed or performed during the hospital encounter of 09/11/21 (from the past 48 hour(s))  ?Lipase, blood     Status: None  ? Collection Time: 09/11/21  9:40 AM  ?Result Value Ref Range  ? Lipase 41 11 - 51 U/L  ?  Comment: Performed at [facility], [address] [place], [place] [address]  ?Comprehensive metabolic panel     Status: Abnormal  ? Collection Time: 09/11/21  9:40 AM  ?Result Value Ref Range  ? Sodium 130 (L) 135 - 145 mmol/L  ? Potassium 4.1 3.5 - 5.1 mmol/L  ? Chloride 91 (L) 98 - 111 mmol/L  ? CO2 27 22 - 32  mmol/L  ? Glucose, Bld 237 (H) 70 - 99 mg/dL  ?  Comment: Glucose reference range applies only to samples taken after fasting for at least 8 hours.  ? BUN 14 8 - 23 mg/dL  ? Creatinine, Ser 0.82 0.61 - 1.24 mg/dL  ? Calcium 9.9 8.9 - 10.3 mg/dL  ? Total Protein 8.0 6.5 - 8.1 g/dL  ? Albumin 3.5 3.5 - 5.0 g/dL  ? AST 17 15 - 41 U/L  ? ALT 24 0 - 44 U/L  ? Alkaline Phosphatase 97 38 - 126 U/L  ? Total Bilirubin 1.4 (H) 0.3 - 1.2 mg/dL  ? GFR, Estimated >60 >60 mL/min  ?  Comment: (NOTE) ?Calculated using the CKD-EPI Creatinine Equation (2021) ?  ? Anion gap 12 5 - 15  ?   Comment: Performed at [facility], [address], [place], [place] [address]  ?CBC     Status: Abnormal  ? Collection Time: 09/11/21  9:40 AM  ?Result Value Ref Range  ? WBC 24.5 (H) 4.0 - 10.5 K/uL  ? RBC 5.44 4.22 - 5.81 MIL/uL  ? Hemoglobin 15.8 13.0 - 17.0 g/dL  ? HCT 46.9 39.0 - 52.0 %  ? MCV 86.2 80.0 - 100.0 fL  ? MCH 29.0 26.0 - 34.0 pg  ? MCHC 33.7 30.0 - 36.0 g/dL  ? RDW 12.6 11.5 - 15.5 %  ? Platelets 257 150 - 400 K/uL  ? nRBC 0.0 0.0 - 0.2 %  ?  Comment: Performed at [facility], [address], [place], [place] [address]  ?Resp Panel by RT-PCR (Flu A&B, Covid) Nasopharyngeal Swab     Status: None  ? Collection Time: 09/11/21 10:20 AM  ? Specimen: Nasopharyngeal Swab; Nasopharyngeal(NP) swabs in vial transport medium  ?Result Value Ref Range  ? SARS Coronavirus 2 by RT PCR NEGATIVE NEGATIVE  ?  Comment: (NOTE) ?SARS-CoV-2 target nucleic acids are NOT DETECTED. ? ?The SARS-CoV-2 RNA is generally detectable in upper respiratory ?specimens during the acute phase of infection. The lowest ?concentration of SARS-CoV-2 viral copies this assay can detect is ?138 copies/mL. A negative result does not preclude SARS-Cov-2 ?infection and should not be used as the sole basis for treatment or ?other patient management decisions. A negative result may occur with  ?improper specimen collection/handling, submission of specimen other ?than nasopharyngeal swab, presence of viral mutation(s) within the ?areas targeted by this assay, and inadequate number of viral ?copies(<138 copies/mL). A negative result must be combined with ?clinical observations, patient history, and epidemiological ?information. The expected result is Negative. ? ?Fact Sheet for Patients:  ?BloggerCourse.com ? ?Fact Sheet for Healthcare Providers:  ?SeriousBroker.it ? ?This test is no t yet approved or cleared by the [place] FDA and  ?has been authorized for detection and/or diagnosis of  SARS-CoV-2 by ?FDA under an Emergency Use Authorization (EUA). This EUA will remain  ?in effect (meaning this test can be used) for the duration of the ?COVID-19 declaration under Section 564(b)(1) of the Act, 21 ?U.S.C.section 360bbb-3(b)(1), unless the authorization is terminated  ?or revoked sooner.  ? ? ?  ? Influenza A by PCR NEGATIVE NEGATIVE  ? Influenza B by PCR NEGATIVE NEGATIVE  ?  Comment: (NOTE) ?The Xpert Xpress SARS-CoV-2/FLU/RSV plus assay is intended as an aid ?in the diagnosis of influenza from Nasopharyngeal swab specimens and ?should not be used as a sole basis for treatment. Nasal washings and ?aspirates are unacceptable for Xpert Xpress SARS-CoV-2/FLU/RSV ?testing. ? ?Fact Sheet for Patients: ?BloggerCourse.com ? ?  Fact Sheet for Healthcare Providers: ?SeriousBroker.it ? ?This test is not yet approved or cleared by the [place] FDA and ?has been authorized for detection and/or diagnosis of SARS-CoV-2 by ?FDA under an Emergency Use Authorization (EUA). This EUA will remain ?in effect (meaning this test can be used) for the duration of the ?COVID-19 declaration under Section 564(b)(1) of the Act, 21 U.S.C. ?section 360bbb-3(b)(1), unless the authorization is terminated or ?revoked. ? ?Performed at [facility], [address], [place], [place] [address] ?  ? ? ?CT ABDOMEN PELVIS W CONTRAST ? ?Addendum Date: 09/11/2021   ?ADDENDUM REPORT: 09/11/2021 12:28 ADDENDUM: These results were called by telephone at the time of interpretation on 09/11/2021 at 12:28 pm to provider Gerhard Munch , who verbally acknowledged these results. Electronically Signed   By: Donzetta Kohut M.D.   On: 09/11/2021 12:28  ? ?Result Date: 09/11/2021 ?CLINICAL DATA:  A 61 year old male presents with nausea, vomiting and abdominal pain. EXAM: CT ABDOMEN AND PELVIS WITH CONTRAST TECHNIQUE: Multidetector CT imaging of the abdomen and pelvis was performed using the standard  protocol following bolus administration of intravenous contrast. RADIATION DOSE REDUCTION: This exam was performed according to the departmental dose-optimization program which includes automated exposure control, adjust

## 2021-09-11 NOTE — Progress Notes (Signed)
Called to update the patient's wife, Angel Berry, after the surgery.  It was explained that we were able to remove his appendix without significant difficulty.  His appendix was necrotic and perforated.  I further explained that we cleaned out his abdomen as best we could.  He will be admitted to the floor and likely be in the hospital for at least 2 days.  I further explained that he will likely have an ileus postoperatively given the amount of inflammation in his abdomen.  We will trial him on clear liquid diets today to see how he tolerates this.  He will continue to receive his IV antibiotics.  All of her questions were answered to her expressed satisfaction. ? ?Plan: ?-CLD ?-We will consider advancement later tonight if patient tolerates clears without nausea and vomiting ?-Zosyn ?-IV fluids ?-Pain control and antiemetics as needed ?-AM labs ? ?Arlind Klingerman, DO ?[facility] ?[address] ?[place], [place] [address] ?[phone] (office) ? ?

## 2021-09-12 ENCOUNTER — Encounter (HOSPITAL_COMMUNITY): Payer: Self-pay | Admitting: Surgery

## 2021-09-12 DIAGNOSIS — E119 Type 2 diabetes mellitus without complications: Secondary | ICD-10-CM

## 2021-09-12 DIAGNOSIS — E1165 Type 2 diabetes mellitus with hyperglycemia: Secondary | ICD-10-CM

## 2021-09-12 DIAGNOSIS — E871 Hypo-osmolality and hyponatremia: Secondary | ICD-10-CM | POA: Diagnosis not present

## 2021-09-12 DIAGNOSIS — D72829 Elevated white blood cell count, unspecified: Secondary | ICD-10-CM

## 2021-09-12 DIAGNOSIS — K3532 Acute appendicitis with perforation and localized peritonitis, without abscess: Secondary | ICD-10-CM | POA: Diagnosis not present

## 2021-09-12 LAB — HIV ANTIBODY (ROUTINE TESTING W REFLEX): HIV Screen 4th Generation wRfx: NONREACTIVE

## 2021-09-12 LAB — CBC
HCT: 40.7 % (ref 39.0–52.0)
Hemoglobin: 13.3 g/dL (ref 13.0–17.0)
MCH: 29.8 pg (ref 26.0–34.0)
MCHC: 32.7 g/dL (ref 30.0–36.0)
MCV: 91.3 fL (ref 80.0–100.0)
Platelets: 207 10*3/uL (ref 150–400)
RBC: 4.46 MIL/uL (ref 4.22–5.81)
RDW: 13 % (ref 11.5–15.5)
WBC: 33.2 10*3/uL — ABNORMAL HIGH (ref 4.0–10.5)
nRBC: 0 % (ref 0.0–0.2)

## 2021-09-12 LAB — BASIC METABOLIC PANEL
Anion gap: 9 (ref 5–15)
BUN: 19 mg/dL (ref 8–23)
CO2: 25 mmol/L (ref 22–32)
Calcium: 9.2 mg/dL (ref 8.9–10.3)
Chloride: 100 mmol/L (ref 98–111)
Creatinine, Ser: 0.79 mg/dL (ref 0.61–1.24)
GFR, Estimated: >60 mL/min (ref >60)
Glucose, Bld: 163 mg/dL — ABNORMAL HIGH (ref 70–99)
Potassium: 3.7 mmol/L (ref 3.5–5.1)
Sodium: 134 mmol/L — ABNORMAL LOW (ref 135–145)

## 2021-09-12 LAB — HEMOGLOBIN A1C
Hgb A1c MFr Bld: 7.6 % — ABNORMAL HIGH (ref 4.8–5.6)
Mean Plasma Glucose: 171.42 mg/dL

## 2021-09-12 LAB — GLUCOSE, CAPILLARY
Glucose-Capillary: 152 mg/dL — ABNORMAL HIGH (ref 70–99)
Glucose-Capillary: 134 mg/dL — ABNORMAL HIGH (ref 70–99)
Glucose-Capillary: 130 mg/dL — ABNORMAL HIGH (ref 70–99)
Glucose-Capillary: 126 mg/dL — ABNORMAL HIGH (ref 70–99)

## 2021-09-12 MED ORDER — METFORMIN HCL 500 MG PO TABS: 500.0000 mg | ORAL_TABLET | Freq: Two times a day (BID) | ORAL | 1 refills | Status: DC

## 2021-09-12 MED ORDER — BLOOD GLUCOSE MONITOR KIT: PACK | 0 refills | Status: AC

## 2021-09-12 MED ORDER — METFORMIN HCL 500 MG PO TABS
500.0000 mg | ORAL_TABLET | Freq: Two times a day (BID) | ORAL | Status: DC
  Administered 2021-09-12 – 2021-09-15 (×6): 500 mg via ORAL
  Filled 2021-09-12 (×6): qty 1

## 2021-09-12 MED ORDER — LIVING WELL WITH DIABETES BOOK: Freq: Once | Status: AC

## 2021-09-12 NOTE — Assessment & Plan Note (Signed)
See above

## 2021-09-12 NOTE — Progress Notes (Signed)
[facility] Progress Note ? ?1 Day Post-Op  ?Subjective: ?Angel Berry is a 61 y.o. male s/p laparoscopic appendectomy. On morning rounds patient is resting comfortably and denies any abdominal pain but does endorse some soreness along his incision sites. He was able to tolerate clears yesterday without nausea or vomiting. He denies any fever or chills. He has not had any bowel movement but has been passing flatus. He has been able to ambulate to use the restroom without difficulty.  ? ?Objective: ?Vital signs in last 24 hours: ?Temp:  [97.7 ?F (36.5 ?C)-99.8 ?F (37.7 ?C)] 97.7 ?F (36.5 ?C) (03/24 8841) ?Pulse Rate:  [78-104] 78 (03/24 6606) ?Resp:  [16-24] 20 (03/24 3016) ?BP: (98-145)/(45-79) 145/70 (03/24 0109) ?SpO2:  [88 %-98 %] 97 % (03/24 3235) ?Weight:  [120.2 kg] 120.2 kg (03/23 5732) ?  ? ?Intake/Output from previous day: ?03/23 0701 - 03/24 0700 ?In: 4119.9 [I.V.:2869.8; IV Piggyback:1250.1] ?Out: 520 [Urine:100; Blood:20] ?Intake/Output this shift: ?No intake/output data recorded. ? ?General appearance: alert, cooperative, no distress, and morbidly obese ?Resp: clear to auscultation bilaterally ?Cardio: regular rate and rhythm, S1, S2 normal, no murmur, click, rub or gallop ?GI: soft, non-tender; bowel sounds normal; no masses,  no organomegaly and incision sites are clean, dry, and well healing.  ? ?Lab Results:  ?Recent Labs  ?  09/11/21 ?2025 09/12/21 ?4270  ?WBC 24.5* 33.2*  ?HGB 15.8 13.3  ?HCT 46.9 40.7  ?PLT 257 207  ? ?BMET ?Recent Labs  ?  09/11/21 ?6237 09/12/21 ?6283  ?NA 130* 134*  ?K 4.1 3.7  ?CL 91* 100  ?CO2 27 25  ?GLUCOSE 237* 163*  ?BUN 14 19  ?CREATININE 0.82 0.79  ?CALCIUM 9.9 9.2  ? ?PT/INR ?No results for input(s): LABPROT, INR in the last 72 hours. ? ?Studies/Results: ?CT ABDOMEN PELVIS W CONTRAST ? ?Addendum Date: 09/11/2021   ?ADDENDUM REPORT: 09/11/2021 12:28 ADDENDUM: These results were called by telephone at the time of interpretation on 09/11/2021 at 12:28  pm to provider Gerhard Munch , who verbally acknowledged these results. Electronically Signed   By: Donzetta Kohut M.D.   On: 09/11/2021 12:28  ? ?Result Date: 09/11/2021 ?CLINICAL DATA:  A 61 year old male presents with nausea, vomiting and abdominal pain. EXAM: CT ABDOMEN AND PELVIS WITH CONTRAST TECHNIQUE: Multidetector CT imaging of the abdomen and pelvis was performed using the standard protocol following bolus administration of intravenous contrast. RADIATION DOSE REDUCTION: This exam was performed according to the departmental dose-optimization program which includes automated exposure control, adjustment of the mA and/or kV according to patient size and/or use of iterative reconstruction technique. CONTRAST:  OMNIPAQUE IOHEXOL 300 MG/ML  SOLN COMPARISON:  None FINDINGS: Lower chest: Incidental imaging of the lung bases with signs of basilar atelectasis. No effusion. No consolidative changes. Hepatobiliary: Marked hepatic steatosis with lobular hepatic contours. Portal vein is patent. Post cholecystectomy without signs of biliary duct distension. Hepatic veins are patent. Pancreas: Normal, without mass, inflammation or ductal dilatation. Spleen: Normal. Adrenals/Urinary Tract: Adrenal glands are unremarkable. Symmetric renal enhancement. No sign of hydronephrosis. No suspicious renal lesion or perinephric stranding. Urinary bladder is grossly unremarkable. Cyst arises from the lower pole the RIGHT kidney measuring 3 cm. Stomach/Bowel: Signs of acute appendicitis with appendicolith at the base of the appendix and marked appendiceal dilation measuring 17 mm due to obstruction. Periappendiceal fluid without focal features or peripheral enhancement. Stranding in the adjacent small bowel mesentery and in the RIGHT lower quadrant in general with small amount of fluid also  tracking into the pelvis. No defect present in wall enhancement of the appendix but with mildly irregular appearance of the wall.  Secondary inflammation of the adjacent ileum. No free air. Some stranding along the LEFT flank and in the low abdomen towards the midline. (Image 64/2) Vascular/Lymphatic: Portal vein is patent. SMV is patent. The normal caliber of the abdominal aorta. Calcified atheromatous plaque of the abdominal aorta. Smooth contour of the IVC. There is no gastrohepatic or hepatoduodenal ligament lymphadenopathy. No retroperitoneal or mesenteric lymphadenopathy. No pelvic sidewall lymphadenopathy. Reproductive: Unremarkable by CT. Other: No pneumoperitoneum. Trace free fluid in the pelvis. Mild stranding about LEFT lower quadrant bowel loops and in the midline as discussed. Musculoskeletal: No acute bone finding. No destructive bone process. Spinal degenerative changes. Moderate 2 cm size defect in the RIGHT inguinal region compatible with inguinal hernia containing fat. IMPRESSION: 1. Signs of acute appendicitis, obstructed at the base by appendicolith measuring 13 mm with smaller appendicolith near the tip of the appendix and suspicion for perforation with generalized inflammation in the RIGHT pelvis, cul-de-sac and in the midline and LEFT hemiabdomen as discussed. No pneumoperitoneum or well-formed abscess at this time. 2. Secondary inflammation of the adjacent ileum. 3. Marked hepatic steatosis with lobular hepatic contours. Correlate with any clinical or laboratory evidence of liver disease. There is gynecomastia which can be seen in the setting of hepatic dysfunction. 4. Moderate fat containing RIGHT inguinal hernia. 5. Aortic atherosclerosis. Aortic Atherosclerosis (ICD10-I70.0). Electronically Signed: By: Donzetta Kohut M.D. On: 09/11/2021 12:22   ? ?Anti-infectives: ?Anti-infectives (From admission, onward)  ? ? Start     Dose/Rate Route Frequency Ordered Stop  ? 09/11/21 1700  piperacillin-tazobactam (ZOSYN) IVPB 3.375 g       ? 3.375 g ?12.5 mL/hr over 240 Minutes Intravenous Every 8 hours 09/11/21 1646 09/18/21 1359   ? 09/11/21 1245  piperacillin-tazobactam (ZOSYN) IVPB 3.375 g       ? 3.375 g ?100 mL/hr over 30 Minutes Intravenous  Once 09/11/21 1235 09/11/21 1643  ? ?  ? ? ?Assessment/Plan: ?s/p Procedure(s): ?APPENDECTOMY LAPAROSCOPIC ? ?Patient is a 61 y.o. male POD #1 from laparoscopic appendectomy  ? ?-Patient is doing well with no nausea, vomiting, or abdominal pain.  ?-Physical exam and vitals are reassuring.  ?-Leukocytosis increased from WBC count of 24.5 to 33. Patient is currently afebrile however will continue to monitor given the increase in WBC. Will continue antibiotics and IV fluids.  ?-Patient was able to tolerate clear liquid diet without difficulty. Will advance to full liquid diet this morning.  ? ? ? LOS: 1 day  ? ? ?Angel Berry Murrell Converse ?09/12/2021 ? ? ?

## 2021-09-12 NOTE — Assessment & Plan Note (Addendum)
-  Mild and Improving ?-Na+ went from 130 -> 134 -> 136 ?-Received IVF Hydration and now stopped  ?-Continue to Monitor and Trend and Repeat CMP in the AM  ?

## 2021-09-12 NOTE — Assessment & Plan Note (Addendum)
-  Per primary team  ?-Underwent a laparoscopic cholecystectomy and is currently on IV Zosyn for antibiotic coverage and will continue  ?-Received IV fluids and have now discontinued given 1+ LE edema  ?-Pain control with hydromorphone 0.5 mg every 4 as needed for severe pain as well as oxycodone 5 to 10 mg every 4 as needed for moderate pain ?-DVT prophylaxis per surgical recommendations and he is on enoxaparin 40 mg subcu ? ?

## 2021-09-12 NOTE — Progress Notes (Signed)
Initial Nutrition Assessment ? ?DOCUMENTATION CODES:  ? ?Morbid obesity ? ?INTERVENTION:  ?Nutrition education: newly diagnosed diabetes  ? ?Follow diet advancement add ONS as indicated  ? ?NUTRITION DIAGNOSIS:  ? ?Food and nutrition related knowledge deficit related to  (Newly diagnosed DM) as evidenced by per patient/family report, A1C-7.6%.  ? ? ?GOAL:  ? (Patient will tranistion to sugar free beverages and limit excess portions of high cho foods) ? ? ?MONITOR:  ?Diet advancement, PO intake, Weight trends ? ?REASON FOR ASSESSMENT:  ? ?Consult ?Diet education ? ?ASSESSMENT: Patient is a 61 yo morbidly obese male who presents with abdominal pain.  ? ?CT findings: acute appendicitis and with possible perforation with WBC-24.5. ? ?Status post laparoscopic appendectomy 3/23 post op-diagnosis acute gangrenous perforated appendicitis.  ? ?Patient and his wife and mother are present. Patient endorses good appetite prior to acute onset of symptoms. ? ?Patient is sitting up on stool having lunch during RD visit. Reviewed General Nutrition Guidelines for Diabetes and provided meal planning ideas. Encouraged balance and moderation. Emphasized portion control.  ? ? He is accustomed to eating 3 meals daily and his wife cooks for them. His is agreeable to adjusting his food and beverage choices to better manage his blood glucose.  ? ?He is currently receiving clear liquids and is at risk for ileus per MD. ? ?Medications: insulin, metformin. Antibiotics. ? ?Weights reviewed. Currently patient weighs 120.2 kg  ? ?CBG (last 3)  ?Recent Labs  ?  09/11/21 ?2143 09/12/21 ?0746 09/12/21 ?1142  ?GLUCAP 225* 152* 134*  ?  ? ?Labs: A1C-7.6% ? ?  Latest Ref Rng & Units 09/12/2021  ?  5:48 AM 09/11/2021  ?  9:40 AM  ?BMP  ?Glucose 70 - 99 mg/dL 774   128    ?BUN 8 - 23 mg/dL 19   14    ?Creatinine 0.61 - 1.24 mg/dL 7.86   7.67    ?Sodium 135 - 145 mmol/L 134   130    ?Potassium 3.5 - 5.1 mmol/L 3.7   4.1    ?Chloride 98 - 111 mmol/L 100   91     ?CO2 22 - 32 mmol/L 25   27    ?Calcium 8.9 - 10.3 mg/dL 9.2   9.9    ?  ? ?Diet Order:   ?Diet Order   ? ?       ?  Diet full liquid Room service appropriate? Yes; Fluid consistency: Thin  Diet effective now       ?  ? ?  ?  ? ?  ? ? ?EDUCATION NEEDS:  ?Education needs have been addressed ? ?Skin:  Skin Assessment: Skin Integrity Issues: ?Skin Integrity Issues:: Incisions ?Incisions: closed abdominal incision ? ?Last BM:  unknown ? ?Height:  ? ?Ht Readings from Last 1 Encounters:  ?09/11/21 5\' 7"  (1.702 m)  ? ? ?Weight:  ? ?Wt Readings from Last 1 Encounters:  ?09/11/21 120.2 kg  ? ? ?Ideal Body Weight:   67 kg ? ?BMI:  Body mass index is 41.5 kg/m?. ? ?Estimated Nutritional Needs:  ? ?Kcal:  2100-2300 ? ?Protein:  90-95 gr ? ?Fluid:  2.1-2.3 liters daily ? ?09/13/21 MS,RD,CSG,LDN ?Contact: AMION  ?

## 2021-09-12 NOTE — Assessment & Plan Note (Addendum)
-  Complicates overall prognosis and care ?-Estimated body mass index is 41.5 kg/m? as calculated from the following: ?  Height as of this encounter: 5\' 7"  (1.702 m). ?  Weight as of this encounter: 120.2 kg.  ?-Weight Loss and Dietary Counseling given and he will need continued PCP follow-up for further weight management ?

## 2021-09-12 NOTE — Assessment & Plan Note (Addendum)
-   Initially in the setting of appendicitis but then worsened in the setting of steroid demargination and is now gone from 24.5 -> 33.2 -> 20.2 -> 21.7 -> 18.4 ?-Currently getting IV antibiotics with Zosyn and will defer further Abx Management to General Surgery ?-Continue to monitor for signs and symptoms of infection ?-Repeat CBC within 1 week at D/C  ?

## 2021-09-12 NOTE — Progress Notes (Signed)
Inpatient Diabetes Program Recommendations ? ?AACE/ADA: New Consensus Statement on Inpatient Glycemic Control (2015) ? ?Target Ranges:  Prepandial:   less than 140 mg/dL ?     Peak postprandial:   less than 180 mg/dL (1-2 hours) ?     Critically ill patients:  140 - 180 mg/dL  ? ?Lab Results  ?Component Value Date  ? GLUCAP 152 (H) 09/12/2021  ? HGBA1C 7.6 (H) 09/11/2021  ? ? ?Review of Glycemic Control ? Latest Reference Range & Units 09/11/21 18:17 09/11/21 21:43 09/12/21 07:46  ?Glucose-Capillary 70 - 99 mg/dL 734 (H) 193 (H) 790 (H)  ?(H): Data is abnormally high ? ?Diabetes history: No prior hx ?Current orders for Inpatient glycemic control: Novolog 0-15 units tid correction ? ?Inpatient Diabetes Program Recommendations:   ?Patient has no prior hx DM. Spoke with patient to review A1c of 7.6 and gave patient normal range of A1c. ?Patient does not have a PCP so consulted TOC for assistance. ?Patient states he drinks sugar in his coffee and drinks koolaid with sugar along with other high carbohydrate intake. Patient states willingness to transition to sugar free and lower carbohydrate foods. ? ?Patient will need glucose meter @ discharge home. ?Also consider adding Metformin 500 mg bid @ discharge. ? ?Thank you, ?Billy Fischer Aleric Froelich, RN, MSN, CDE  ?Diabetes Coordinator ?Inpatient Glycemic Control Team ?Team Pager [phone] (8am-5pm) ?09/12/2021 9:11 AM ? ? ? ? ? ?

## 2021-09-12 NOTE — Anesthesia Postprocedure Evaluation (Signed)
Anesthesia Post Note ? ?Patient: Angel Berry ? ?Procedure(s) Performed: APPENDECTOMY LAPAROSCOPIC (Abdomen) ? ?Patient location during evaluation: Nursing Unit ?Anesthesia Type: General ?Level of consciousness: awake and alert and oriented ?Pain management: pain level controlled ?Vital Signs Assessment: post-procedure vital signs reviewed and stable ?Respiratory status: spontaneous breathing and respiratory function stable ?Cardiovascular status: blood pressure returned to baseline and stable ?Postop Assessment: no apparent nausea or vomiting ?Anesthetic complications: no ? ? ?No notable events documented. ? ? ?Last Vitals:  ?Vitals:  ? 09/11/21 2146 09/12/21 0611  ?BP: 131/72 (!) 145/70  ?Pulse: (!) 103 78  ?Resp: 18 20  ?Temp: 36.6 ?Angel 36.5 ?Angel  ?SpO2: 94% 97%  ?  ?Last Pain:  ?Vitals:  ? 09/12/21 0611  ?TempSrc: Oral  ?PainSc:   ? ? ?  ?  ?  ?  ?  ?  ? ?Angel Berry Angel Berry ? ? ? ? ?

## 2021-09-12 NOTE — Assessment & Plan Note (Addendum)
-   New onset and hemoglobin A1c 7.6. ?-We will need an outpatient diabetic foot examination as well as a eye examination ?-Received a Decadron injection for surgical intervention ?-CBGs ranging from 111-193 ?-Agree with current inpatient recommendations with the sliding scale ?-Have also added metformin 500 g p.o. twice daily and will continue at discharge ?-Patient will require glucometer and close PCP follow-up for further blood sugar management ?-He was extensively counseled about diabetic progression and adequate blood sugar management and he states he is willing to make lifestyle changes. ?-Follow blood sugar trend closely ?-May be a candidate for Ozempic but will not start here and defer to PCP to evaluate ?-Diabetic education coordinator consulted and following as well and he will need outpatient diabetic coordinator follow-up as well ? ? ?

## 2021-09-12 NOTE — Consult Note (Signed)
Initial Consultation Note ? ? ?Patient: Angel Berry ZCH:[id number] DOB: Oct 20, 1960 PCP: Pcp, No ?DOA: 09/11/2021 ?DOS: the patient was seen and examined on 09/12/2021 ?Primary service: Rusty Aus,* ? ?Referring physician: Dr. Barnetta Chapel Pappayliou ?Reason for consult: New Onset Diabetes Management and Recommendations ? ?Assessment and Plan: ?* Acute perforated appendicitis ?-Per primary team  ?-Underwent a laparoscopic cholecystectomy and is currently on IV Zosyn for antibiotic coverage ?-Received IV fluids and is on pain control with hydromorphone 0.5 mg every 4 as needed for severe pain as well as oxycodone 5 to 10 mg every 4 as needed for moderate pain ?-DVT prophylaxis per surgical recommendations and he is on enoxaparin 40 mg subcu ? ? ?Hyponatremia ?-Mild and Improving ?-Na+ went from 130 -> 134 ?-Received IVF Hydration ?-Continue to Monitor and Trend and Repeat CMP in the AM  ? ?Leukocytosis ?- Initially in the setting of appendicitis but then worsened in the setting of steroid demargination and is now gone from 24.5 -> 33.2 ?-Currently getting IV antibiotics with Zosyn ?-Continue to monitor for signs and symptoms of infection ?-repeat CBC in the a.m. prior to discharging ? ?Obesity, Class III, BMI 40-49.9 (morbid obesity) ([place]) ?-Complicates overall prognosis and care ?-Estimated body mass index is 41.5 kg/m? as calculated from the following: ?  Height as of this encounter: 5' 7"  (1.702 m). ?  Weight as of this encounter: 120.2 kg.  ?-Weight Loss and Dietary Counseling given and he will need continued PCP follow-up for further weight management ? ? ? ?DM (diabetes mellitus) ([place]) ?- New onset and hemoglobin A1c 7.6. ?-We will need an outpatient diabetic foot examination as well as a eye examination ?-Received a Decadron injection for surgical intervention ?-CBGs ranging from 134-274 ?-Agree with current inpatient recommendations with the sliding scale ?-Have also added metformin 500 g p.o.  twice daily and will continue at discharge ?-Patient will require glucometer and close PCP follow-up for further blood sugar management ?-He was extensively counseled about diabetic progression and adequate blood sugar management and he states he is willing to make lifestyle changes. ?-Follow blood sugar trend closely ?-May be a candidate for Ozempic but will not start here and defer to PCP to evaluate ?-Diabetic education coordinator consulted and following as well and he will need outpatient diabetic coordinator follow-up as well ? ? ? ?Acute appendicitis ?-See above ? ? ?TRH will continue to follow the patient. ? ?HPI: Angel Berry is a 61 y.o. male with no real past medical history who does not regularly follow-up with a physician who presented with lower abdominal pain that was diffuse across the lower abdomen.  Since that time he had frequency of stools, episodes of vomiting and no relief with over-the-counter medication.  He has a prior cholecystectomy but still has his appendix.  Further work-up was done and showed acute appendicitis versus potential surrounding inflammation with questionable perforation.  General surgery consulted and they admitted the patient and he underwent laparoscopic cholecystectomy.  Preoperatively he was given a dose of Decadron but her blood sugars were elevated even prior to the steroid injection.  Hemoglobin A1c was checked and he was found to be newly diabetic with a hemoglobin A1c of 7.6.  TRH was asked to manage this patient new onset diabetes and provide further recommendations. ? ?Currently patient denies any complaints and denies any nausea, vomiting, abdominal pain patient complains of soreness.  Denies any chest pain, dysuria, increased frequency or any diarrhea. ? ?Review of Systems: As mentioned in the history of present  illness. All other systems reviewed and are negative. ?History reviewed. No pertinent past medical history. ?Past Surgical History:  ?Procedure  Laterality Date  ? CHOLECYSTECTOMY    ? ?Social History:  reports that he has quit smoking. His smoking use included cigarettes. He has never used smokeless tobacco. He reports current alcohol use. He reports that he does not currently use drugs. ? ?ALLERGIES ?No Known Allergies ? ?Family History ?History reviewed.  Patient's mother and father are both diabetic and have hypertension ? ?Prior to Admission medications   ?Medication Sig Start Date End Date Taking? Authorizing Provider  ?metFORMIN (GLUCOPHAGE) 500 MG tablet Take 1 tablet (500 mg total) by mouth 2 (two) times daily with a meal. 09/12/21 09/12/22 Yes Alekai Pocock Latif, DO  ?blood glucose meter kit and supplies KIT Dispense based on patient and insurance preference. Use up to four times daily as directed. 09/12/21   Kerney Elbe, DO  ? ?Physical Exam: ?Vitals:  ? 09/11/21 1715 09/11/21 1734 09/11/21 2146 09/12/21 1460  ?BP:  108/61 131/72 (!) 145/70  ?Pulse:  (!) 104 (!) 103 78  ?Resp: (!) 24 16 18 20   ?Temp: 98.3 ?F (36.8 ?C) 98.8 ?F (37.1 ?C) 97.9 ?F (36.6 ?C) 97.7 ?F (36.5 ?C)  ?TempSrc:  Oral Oral Oral  ?SpO2: 93% 95% 94% 97%  ?Weight:      ?Height:      ? ?Examination: ?Physical Exam: ? ?Constitutional: WN/WD obese AAM in NAD appears calm ?Respiratory: Slightly diminished to auscultation bilaterally, no wheezing, rales, rhonchi or crackles. Normal respiratory effort and patient is not tachypenic. No accessory muscle use.  ?Cardiovascular: RRR, no murmurs / rubs / gallops. S1 and S2 auscultated. No extremity edema.  ?Abdomen: Soft, milldy-tender, Distended 2/2 body habitus Bowel sounds positive.  ?GU: Deferred. ?Musculoskeletal: No clubbing / cyanosis of digits/nails. No joint deformity upper and lower extremities.  ?Neurologic: CN 2-12 grossly intact with no focal deficits.  ?Psychiatric: Normal judgment and insight. Alert and oriented x 3. Normal mood and appropriate affect.  ? ?Data Reviewed:  ? ?I have independently reviewed and assessed the  patient's clinical laboratory data and he has an elevated blood glucose as well as a WBC.  Sodium is 134. ? ?DVT prophylaxis: Enoxaparin and SCDs ? ?Family Communication: Discussed with wife and mother at bedside ?Primary team communication: I spoke with Dr. Okey Dupre about the Consultation ?Thank you very much for involving [place] in the care of your patient. ? ?Author: ?Raiford Noble, DO ?[facility]  ?09/12/2021 1:36 PM ? ?For on call review [email or web address].  ?

## 2021-09-13 DIAGNOSIS — K3532 Acute appendicitis with perforation and localized peritonitis, without abscess: Secondary | ICD-10-CM | POA: Diagnosis not present

## 2021-09-13 DIAGNOSIS — K5900 Constipation, unspecified: Secondary | ICD-10-CM

## 2021-09-13 DIAGNOSIS — K352 Acute appendicitis with generalized peritonitis, without abscess: Secondary | ICD-10-CM

## 2021-09-13 DIAGNOSIS — E871 Hypo-osmolality and hyponatremia: Secondary | ICD-10-CM | POA: Diagnosis not present

## 2021-09-13 LAB — CBC
HCT: 39.2 % (ref 39.0–52.0)
Hemoglobin: 12.9 g/dL — ABNORMAL LOW (ref 13.0–17.0)
MCH: 30 pg (ref 26.0–34.0)
MCHC: 32.9 g/dL (ref 30.0–36.0)
MCV: 91.2 fL (ref 80.0–100.0)
Platelets: 184 10*3/uL (ref 150–400)
RBC: 4.3 MIL/uL (ref 4.22–5.81)
RDW: 13.2 % (ref 11.5–15.5)
WBC: 20.2 10*3/uL — ABNORMAL HIGH (ref 4.0–10.5)
nRBC: 0 % (ref 0.0–0.2)

## 2021-09-13 LAB — GLUCOSE, CAPILLARY
Glucose-Capillary: 137 mg/dL — ABNORMAL HIGH (ref 70–99)
Glucose-Capillary: 155 mg/dL — ABNORMAL HIGH (ref 70–99)
Glucose-Capillary: 126 mg/dL — ABNORMAL HIGH (ref 70–99)

## 2021-09-13 MED ORDER — POLYETHYLENE GLYCOL 3350 17 G PO PACK
17.0000 g | PACK | Freq: Once | ORAL | Status: AC
  Administered 2021-09-13: 17 g via ORAL
  Filled 2021-09-13: qty 1

## 2021-09-13 MED ORDER — BISACODYL 10 MG RE SUPP
10.0000 mg | Freq: Once | RECTAL | Status: AC
  Administered 2021-09-13: 10 mg via RECTAL
  Filled 2021-09-13: qty 1

## 2021-09-13 MED ORDER — SENNOSIDES-DOCUSATE SODIUM 8.6-50 MG PO TABS
1.0000 | ORAL_TABLET | Freq: Two times a day (BID) | ORAL | Status: DC
  Administered 2021-09-13 – 2021-09-15 (×5): 1 via ORAL
  Filled 2021-09-13 (×5): qty 1

## 2021-09-13 MED ORDER — SODIUM CHLORIDE 0.9 % IV SOLN: INTRAVENOUS | Status: DC

## 2021-09-13 NOTE — Hospital Course (Addendum)
Angel Berry is a 61 y.o. male with no real past medical history who does not regularly follow-up with a physician who presented with lower abdominal pain that was diffuse across the lower abdomen.  Since that time he had frequency of stools, episodes of vomiting and no relief with over-the-counter medication.  He has a prior cholecystectomy but still has his appendix.  Further work-up was done and showed acute appendicitis versus potential surrounding inflammation with questionable perforation.  General surgery consulted and they admitted the patient and he underwent laparoscopic cholecystectomy.  Preoperatively he was given a dose of Decadron but her blood sugars were elevated even prior to the steroid injection.  Hemoglobin A1c was checked and he was found to be newly diabetic with a hemoglobin A1c of 7.6.  TRH was asked to manage this patient new onset diabetes and provide further recommendations. ?  ?Currently patient denies any complaints and denies any nausea, vomiting, abdominal pain patient complains of soreness.  Denies any chest pain, dysuria, increased frequency or any diarrhea. ? ?**Interim History ?He is doing well and WBC trending down. Appetite is poor and still has not had a BM. He ambulated in the [place] and did not Desaturate. Stable from a Medical Perspective and Blood Sugar is reasonably well controlled. IVF were discontinued given his worsening Edema. His diet is being advanced and he is tolerating it well. ? ?09/14/21: Still has not had a BM but he overall feels improved and is passing flatus. Not complaining of any real abdominal pain. WBC is still mildly elevated.  ? ?09/15/21: Patient was seen and he states that he has had several bowel movements now.  States that he did not really want to eat the dinner last night because he did not like it but ate his breakfast and states it was a good breakfast.  Ambulating well and WBC is trended down to 18.7.  Patient can be discharged from a medical  perspective given that he is overall stable and will need repeat lab work within 1 week.  We will defer antibiotic management to the surgery team and he will need to follow-up and establish with a PCP and have further weight loss counseling and dietary counseling given in the outpatient setting.  We will discharge the patient home on metformin and a glucometer and defer to PCP to further titrate medications and add medications.  TRH will sign off at this point do not hesitate to call us back for any concerns or questions. ?

## 2021-09-13 NOTE — Assessment & Plan Note (Addendum)
-  Likely Related to Opiates ?-Agree with Bisacodyl Suppository 10 mg RC x1 ?-Added Senna-Docuaste 1 tab po Bid and he received a dose of po Miralax 17 grams x1 this AM ?-Continue to Monitor and still has not had a BM but is passing flatus  ?

## 2021-09-13 NOTE — Progress Notes (Addendum)
[facility] Progress Note ? ?2 Days Post-Op  ?Subjective: ?Patient seen and examined.  He is resting comfortably in bed.  He continues to complain of abdominal soreness, but this is better than the pain that brought him to the emergency department.  He is tolerating his diet without nausea and vomiting, though he does have a decreased appetite.  He passed some flatus this morning, but denies any bowel movements.  Denies fevers and chills. ? ?Objective: ?Vital signs in last 24 hours: ?Temp:  [98 ?F (36.7 ?C)-98.6 ?F (37 ?C)] 98 ?F (36.7 ?C) (03/25 0537) ?Pulse Rate:  [90-110] 97 (03/25 0537) ?Resp:  [18-20] 18 (03/25 0537) ?BP: (128-152)/(67-86) 152/85 (03/25 0537) ?SpO2:  [97 %-100 %] 100 % (03/25 0537) ?  ? ?Intake/Output from previous day: ?03/24 0701 - 03/25 0700 ?In: 4322.8 [P.O.:1440; I.V.:2794.2; IV Piggyback:88.6] ?Out: 600 [Urine:600] ?Intake/Output this shift: ?Total I/O ?In: 120 [P.O.:120] ?Out: -  ? ?General appearance: alert, cooperative, and no distress ?GI: Soft, nondistended, no percussion tenderness, mild tenderness to palpation in bilateral lower quadrants, no rigidity, guarding, or rebound tenderness; incision C/D/I with skin glue in place ? ?Lab Results:  ?Recent Labs  ?  09/12/21 ?1610 09/13/21 ?9604  ?WBC 33.2* 20.2*  ?HGB 13.3 12.9*  ?HCT 40.7 39.2  ?PLT 207 184  ? ?BMET ?Recent Labs  ?  09/11/21 ?5409 09/12/21 ?8119  ?NA 130* 134*  ?K 4.1 3.7  ?CL 91* 100  ?CO2 27 25  ?GLUCOSE 237* 163*  ?BUN 14 19  ?CREATININE 0.82 0.79  ?CALCIUM 9.9 9.2  ? ?PT/INR ?No results for input(s): LABPROT, INR in the last 72 hours. ? ?Studies/Results: ?CT ABDOMEN PELVIS W CONTRAST ? ?Addendum Date: 09/11/2021   ?ADDENDUM REPORT: 09/11/2021 12:28 ADDENDUM: These results were called by telephone at the time of interpretation on 09/11/2021 at 12:28 pm to provider Gerhard Munch , who verbally acknowledged these results. Electronically Signed   By: Donzetta Kohut M.D.   On: 09/11/2021 12:28  ? ?Result  Date: 09/11/2021 ?CLINICAL DATA:  A 61 year old male presents with nausea, vomiting and abdominal pain. EXAM: CT ABDOMEN AND PELVIS WITH CONTRAST TECHNIQUE: Multidetector CT imaging of the abdomen and pelvis was performed using the standard protocol following bolus administration of intravenous contrast. RADIATION DOSE REDUCTION: This exam was performed according to the departmental dose-optimization program which includes automated exposure control, adjustment of the mA and/or kV according to patient size and/or use of iterative reconstruction technique. CONTRAST:  OMNIPAQUE IOHEXOL 300 MG/ML  SOLN COMPARISON:  None FINDINGS: Lower chest: Incidental imaging of the lung bases with signs of basilar atelectasis. No effusion. No consolidative changes. Hepatobiliary: Marked hepatic steatosis with lobular hepatic contours. Portal vein is patent. Post cholecystectomy without signs of biliary duct distension. Hepatic veins are patent. Pancreas: Normal, without mass, inflammation or ductal dilatation. Spleen: Normal. Adrenals/Urinary Tract: Adrenal glands are unremarkable. Symmetric renal enhancement. No sign of hydronephrosis. No suspicious renal lesion or perinephric stranding. Urinary bladder is grossly unremarkable. Cyst arises from the lower pole the RIGHT kidney measuring 3 cm. Stomach/Bowel: Signs of acute appendicitis with appendicolith at the base of the appendix and marked appendiceal dilation measuring 17 mm due to obstruction. Periappendiceal fluid without focal features or peripheral enhancement. Stranding in the adjacent small bowel mesentery and in the RIGHT lower quadrant in general with small amount of fluid also tracking into the pelvis. No defect present in wall enhancement of the appendix but with mildly irregular appearance of the wall. Secondary inflammation of the  adjacent ileum. No free air. Some stranding along the LEFT flank and in the low abdomen towards the midline. (Image 64/2)  Vascular/Lymphatic: Portal vein is patent. SMV is patent. The normal caliber of the abdominal aorta. Calcified atheromatous plaque of the abdominal aorta. Smooth contour of the IVC. There is no gastrohepatic or hepatoduodenal ligament lymphadenopathy. No retroperitoneal or mesenteric lymphadenopathy. No pelvic sidewall lymphadenopathy. Reproductive: Unremarkable by CT. Other: No pneumoperitoneum. Trace free fluid in the pelvis. Mild stranding about LEFT lower quadrant bowel loops and in the midline as discussed. Musculoskeletal: No acute bone finding. No destructive bone process. Spinal degenerative changes. Moderate 2 cm size defect in the RIGHT inguinal region compatible with inguinal hernia containing fat. IMPRESSION: 1. Signs of acute appendicitis, obstructed at the base by appendicolith measuring 13 mm with smaller appendicolith near the tip of the appendix and suspicion for perforation with generalized inflammation in the RIGHT pelvis, cul-de-sac and in the midline and LEFT hemiabdomen as discussed. No pneumoperitoneum or well-formed abscess at this time. 2. Secondary inflammation of the adjacent ileum. 3. Marked hepatic steatosis with lobular hepatic contours. Correlate with any clinical or laboratory evidence of liver disease. There is gynecomastia which can be seen in the setting of hepatic dysfunction. 4. Moderate fat containing RIGHT inguinal hernia. 5. Aortic atherosclerosis. Aortic Atherosclerosis (ICD10-I70.0). Electronically Signed: By: Donzetta Kohut M.D. On: 09/11/2021 12:22   ? ?Anti-infectives: ?Anti-infectives (From admission, onward)  ? ? Start     Dose/Rate Route Frequency Ordered Stop  ? 09/11/21 1700  piperacillin-tazobactam (ZOSYN) IVPB 3.375 g       ? 3.375 g ?12.5 mL/hr over 240 Minutes Intravenous Every 8 hours 09/11/21 1646 09/18/21 1359  ? 09/11/21 1245  piperacillin-tazobactam (ZOSYN) IVPB 3.375 g       ? 3.375 g ?100 mL/hr over 30 Minutes Intravenous  Once 09/11/21 1235 09/11/21 1643   ? ?  ? ? ?Assessment/Plan: ? ?Patient is a 61 year old male who was admitted status post laparoscopic appendectomy on 3/23 for acute perforated and gangrenous appendicitis. ? ?-Leukocytosis improved to 20.2 from 33.2 yesterday ?-Will repeat blood work in AM ?-Continue Zosyn ?-Diet advanced to GI soft ?-Dulcolax suppository ordered ?-IVF held for LE swelling, will monitor PO intake to see if he needs further hydration ?-Pain control and antiemetics as needed ?-Sliding scale insulin for blood sugars, hospitalist service provided input regarding outpatient diabetes management ?-Potential discharge in the next 24 to 48 hours pending patient's leukocytosis, increased oral intake, and possible bowel movement ? ? ? LOS: 2 days  ? ? ?Auriah Hollings A Rayvon Dakin ?09/13/2021 ? ?

## 2021-09-13 NOTE — Progress Notes (Signed)
?PROGRESS NOTE ? ? ? ?Angel Berry  YIR:[id number] DOB: 10-Jul-1960 DOA: 09/11/2021 ?PCP: Pcp, No  ? ?Brief Narrative:  ?Angel Berry is a 61 y.o. male with no real past medical history who does not regularly follow-up with a physician who presented with lower abdominal pain that was diffuse across the lower abdomen.  Since that time he had frequency of stools, episodes of vomiting and no relief with over-the-counter medication.  He has a prior cholecystectomy but still has his appendix.  Further work-up was done and showed acute appendicitis versus potential surrounding inflammation with questionable perforation.  General surgery consulted and they admitted the patient and he underwent laparoscopic cholecystectomy.  Preoperatively he was given a dose of Decadron but her blood sugars were elevated even prior to the steroid injection.  Hemoglobin A1c was checked and he was found to be newly diabetic with a hemoglobin A1c of 7.6.  TRH was asked to manage this patient new onset diabetes and provide further recommendations. ?  ?Currently patient denies any complaints and denies any nausea, vomiting, abdominal pain patient complains of soreness.  Denies any chest pain, dysuria, increased frequency or any diarrhea. ? ?**Interim History ?He is doing well and WBC trending down. Appetite is poor and still has not had a BM. He ambulated in the [place] and did not Desaturate. Stable from a Medical Perspective and Blood Sugar is reasonably well controlled. IVF were discontinued given his worsening Edema. His diet is being advanced.   ? ? ?Assessment and Plan: ?* Acute perforated appendicitis ?-Per primary team  ?-Underwent a laparoscopic cholecystectomy and is currently on IV Zosyn for antibiotic coverage and will continue  ?-Received IV fluids and have now discontinued given 1+ LE edema  ?-Pain control with hydromorphone 0.5 mg every 4 as needed for severe pain as well as oxycodone 5 to 10 mg every 4 as needed for moderate  pain ?-DVT prophylaxis per surgical recommendations and he is on enoxaparin 40 mg subcu ? ? ?Constipation ?-Likely Related to Opiates ?-Agree with Bisacodyl Suppository 10 mg RC x1 ?-Added Senna-Docuaste 1 tab po Bid and he received a dose of po Miralax 17 grams x1 this AM ?-Continue to Monitor  ? ?Hyponatremia ?-Mild and Improving ?-Na+ went from 130 -> 134 on last check  ?-Received IVF Hydration ?-Continue to Monitor and Trend and Repeat CMP in the AM  ? ?Leukocytosis ?- Initially in the setting of appendicitis but then worsened in the setting of steroid demargination and is now gone from 24.5 -> 33.2 -> 20.2 ?-Currently getting IV antibiotics with Zosyn and will defer further Abx Management to General Surgery ?-Continue to monitor for signs and symptoms of infection ?-Repeat CBC within 1 week ? ?Obesity, Class III, BMI 40-49.9 (morbid obesity) (HCC) ?-Complicates overall prognosis and care ?-Estimated body mass index is 41.5 kg/m? as calculated from the following: ?  Height as of this encounter: 5\' 7"  (1.702 m). ?  Weight as of this encounter: 120.2 kg.  ?-Weight Loss and Dietary Counseling given and he will need continued PCP follow-up for further weight management ? ? ? ?DM (diabetes mellitus) (HCC) ?- New onset and hemoglobin A1c 7.6. ?-We will need an outpatient diabetic foot examination as well as a eye examination ?-Received a Decadron injection for surgical intervention ?-CBGs ranging from 126-155 ?-Agree with current inpatient recommendations with the sliding scale ?-Have also added metformin 500 g p.o. twice daily and will continue at discharge ?-Patient will require glucometer and close PCP follow-up for further blood  sugar management ?-He was extensively counseled about diabetic progression and adequate blood sugar management and he states he is willing to make lifestyle changes. ?-Follow blood sugar trend closely ?-May be a candidate for Ozempic but will not start here and defer to PCP to  evaluate ?-Diabetic education coordinator consulted and following as well and he will need outpatient diabetic coordinator follow-up as well ? ? ? ?Acute appendicitis ?-See above ? ?DVT prophylaxis: enoxaparin (LOVENOX) injection 40 mg Start: 09/11/21 1700 ?SCDs Start: 09/11/21 1647 ? ?  Code Status: Full Code ?Family Communication: No family present at bedside ? ?Disposition Plan:  ?Level of care: Med-Surg ?Status is: Inpatient ?Remains inpatient appropriate because: Surgery wants to repeat his bloodwork in the AM and wants the patient to have a BM and better oral intake  ?  ? ?Consultants:  ?TRH ?General Surgery Primary ? ?Procedures:  ? Laparoscopic Appendectomy  ? ?Antimicrobials:  ?Anti-infectives (From admission, onward)  ? ? Start     Dose/Rate Route Frequency Ordered Stop  ? 09/11/21 1700  piperacillin-tazobactam (ZOSYN) IVPB 3.375 g       ? 3.375 g ?12.5 mL/hr over 240 Minutes Intravenous Every 8 hours 09/11/21 1646 09/18/21 1359  ? 09/11/21 1245  piperacillin-tazobactam (ZOSYN) IVPB 3.375 g       ? 3.375 g ?100 mL/hr over 30 Minutes Intravenous  Once 09/11/21 1235 09/11/21 1643  ? ?  ?  ?Subjective: ?Seen and examined at bedside and he was doing fairly well.  Ambulated the hallways without issues.  No nausea or vomiting.  States that he is not eating as much and has not had a bowel movement.  Has some abdominal discomfort.  No other concerns requested this time. ? ?Objective: ?Vitals:  ? 09/13/21 0537 09/13/21 1120 09/13/21 1128 09/13/21 1132  ?BP: (!) 152/85     ?Pulse: 97     ?Resp: 18     ?Temp: 98 ?F (36.7 ?C)     ?TempSrc:      ?SpO2: 100% 97% 94% 93%  ?Weight:      ?Height:      ? ? ?Intake/Output Summary (Last 24 hours) at 09/13/2021 1158 ?Last data filed at 09/13/2021 0900 ?Gross per 24 hour  ?Intake 3962.79 ml  ?Output 600 ml  ?Net 3362.79 ml  ? ?Filed Weights  ? 09/11/21 0938  ?Weight: 120.2 kg  ? ?Examination: ?Physical Exam: ? ?Constitutional: WN/WD obese AAM in NAD ?Respiratory: Diminished to  auscultation bilaterally, no wheezing, rales, rhonchi or crackles. Normal respiratory effort and patient is not tachypenic. No accessory muscle use. Unlabored breathing   ?Cardiovascular: RRR, no murmurs / rubs / gallops. S1 and S2 auscultated. 1+ LE edema ?Abdomen: Soft, mildly-tender, Distended. Bowel sounds positive.  ?GU: Deferred. ?Musculoskeletal: No clubbing / cyanosis of digits/nails. No joint deformity upper and lower extremities.  ?Skin: No rashes, lesions, ulcers on a limited skin evaluation. No induration; Warm and dry.  ?Neurologic: CN 2-12 grossly intact with no focal deficits.  ?Psychiatric: Normal judgment and insight. Alert and oriented x 3. Normal mood and appropriate affect.  ? ?Data Reviewed: I have personally reviewed following labs and imaging studies ? ?CBC: ?Recent Labs  ?Lab 09/11/21 ?[id number] 09/12/21 ?[id number] 09/13/21 ?[id number]  ?WBC 24.5* 33.2* 20.2*  ?HGB 15.8 13.3 12.9*  ?HCT 46.9 40.7 39.2  ?MCV 86.2 91.3 91.2  ?PLT 257 207 184  ? ?Basic Metabolic Panel: ?Recent Labs  ?Lab 09/11/21 ?[id number] 09/12/21 ?[id number]  ?NA 130* 134*  ?K 4.1 3.7  ?CL 91* 100  ?  CO2 27 25  ?GLUCOSE 237* 163*  ?BUN 14 19  ?CREATININE 0.82 0.79  ?CALCIUM 9.9 9.2  ? ?GFR: ?Estimated Creatinine Clearance: 118.8 mL/min (by C-G formula based on SCr of 0.79 mg/dL). ?Liver Function Tests: ?Recent Labs  ?Lab 09/11/21 ?0940  ?AST 17  ?ALT 24  ?ALKPHOS 97  ?BILITOT 1.4*  ?PROT 8.0  ?ALBUMIN 3.5  ? ?Recent Labs  ?Lab 09/11/21 ?0940  ?LIPASE 41  ? ?No results for input(s): AMMONIA in the last 168 hours. ?Coagulation Profile: ?No results for input(s): INR, PROTIME in the last 168 hours. ?Cardiac Enzymes: ?No results for input(s): CKTOTAL, CKMB, CKMBINDEX, TROPONINI in the last 168 hours. ?BNP (last 3 results) ?No results for input(s): PROBNP in the last 8760 hours. ?HbA1C: ?Recent Labs  ?  09/11/21 ?0954  ?HGBA1C 7.6*  ? ?CBG: ?Recent Labs  ?Lab 09/12/21 ?1142 09/12/21 ?1556 09/12/21 ?2041 09/13/21 ?0716 09/13/21 ?1057  ?GLUCAP 134* 130* 126*  137* 155*  ? ?Lipid Profile: ?No results for input(s): CHOL, HDL, LDLCALC, TRIG, CHOLHDL, LDLDIRECT in the last 72 hours. ?Thyroid Function Tests: ?No results for input(s): TSH, T4TOTAL, FREET4, T3FREE, THYROIDAB in

## 2021-09-13 NOTE — Progress Notes (Signed)
Patient ambulated in halls o2 sat are recorded ?

## 2021-09-13 NOTE — Plan of Care (Signed)
?  Problem: Education: ?Goal: Knowledge of General Education information will improve ?Description: Including pain rating scale, medication(s)/side effects and non-pharmacologic comfort measures ?Outcome: Progressing ?  ?Problem: Health Behavior/Discharge Planning: ?Goal: Ability to manage health-related needs will improve ?Outcome: Progressing ?  ?Problem: Clinical Measurements: ?Goal: Ability to maintain clinical measurements within normal limits will improve ?Outcome: Progressing ?Goal: Will remain free from infection ?Outcome: Progressing ?Goal: Diagnostic test results will improve ?Outcome: Progressing ?Goal: Respiratory complications will improve ?Outcome: Progressing ?Goal: Cardiovascular complication will be avoided ?Outcome: Progressing ?  ?Problem: Activity: ?Goal: Risk for activity intolerance will decrease ?Outcome: Progressing ?  ?Problem: Nutrition: ?Goal: Adequate nutrition will be maintained ?Outcome: Progressing ?  ?Problem: Coping: ?Goal: Level of anxiety will decrease ?Outcome: Progressing ?  ?Problem: Elimination: ?Goal: Will not experience complications related to bowel motility ?Outcome: Progressing ?Goal: Will not experience complications related to urinary retention ?Outcome: Progressing ?  ?Problem: Pain Managment: ?Goal: General experience of comfort will improve ?Outcome: Progressing ?  ?Problem: Safety: ?Goal: Ability to remain free from injury will improve ?Outcome: Progressing ?  ?Problem: Skin Integrity: ?Goal: Risk for impaired skin integrity will decrease ?Outcome: Progressing ?  ?Problem: Education: ?Goal: Verbalization of understanding the information provided will improve ?Outcome: Progressing ?  ?Problem: Bowel/Gastric: ?Goal: Gastrointestinal status for postoperative course will improve ?Outcome: Progressing ?  ?Problem: Physical Regulation: ?Goal: Postoperative complications will be avoided or minimized ?Outcome: Progressing ?  ?Problem: Respiratory: ?Goal: Respiratory status  will improve ?Outcome: Progressing ?  ?Problem: Skin Integrity: ?Goal: Demonstration of wound healing without infection will improve ?Outcome: Progressing ?  ?Problem: Education: ?Goal: Knowledge of General Education information will improve ?Description: Including pain rating scale, medication(s)/side effects and non-pharmacologic comfort measures ?Outcome: Progressing ?  ?Problem: Health Behavior/Discharge Planning: ?Goal: Ability to manage health-related needs will improve ?Outcome: Progressing ?  ?Problem: Clinical Measurements: ?Goal: Ability to maintain clinical measurements within normal limits will improve ?Outcome: Progressing ?Goal: Will remain free from infection ?Outcome: Progressing ?Goal: Diagnostic test results will improve ?Outcome: Progressing ?Goal: Respiratory complications will improve ?Outcome: Progressing ?Goal: Cardiovascular complication will be avoided ?Outcome: Progressing ?  ?Problem: Activity: ?Goal: Risk for activity intolerance will decrease ?Outcome: Progressing ?  ?Problem: Nutrition: ?Goal: Adequate nutrition will be maintained ?Outcome: Progressing ?  ?Problem: Coping: ?Goal: Level of anxiety will decrease ?Outcome: Progressing ?  ?Problem: Elimination: ?Goal: Will not experience complications related to bowel motility ?Outcome: Progressing ?Goal: Will not experience complications related to urinary retention ?Outcome: Progressing ?  ?Problem: Pain Managment: ?Goal: General experience of comfort will improve ?Outcome: Progressing ?  ?Problem: Safety: ?Goal: Ability to remain free from injury will improve ?Outcome: Progressing ?  ?Problem: Skin Integrity: ?Goal: Risk for impaired skin integrity will decrease ?Outcome: Progressing ?  ?

## 2021-09-14 DIAGNOSIS — E871 Hypo-osmolality and hyponatremia: Secondary | ICD-10-CM | POA: Diagnosis not present

## 2021-09-14 DIAGNOSIS — D649 Anemia, unspecified: Secondary | ICD-10-CM

## 2021-09-14 DIAGNOSIS — E876 Hypokalemia: Secondary | ICD-10-CM

## 2021-09-14 DIAGNOSIS — K3532 Acute appendicitis with perforation and localized peritonitis, without abscess: Secondary | ICD-10-CM | POA: Diagnosis not present

## 2021-09-14 DIAGNOSIS — K352 Acute appendicitis with generalized peritonitis, without abscess: Secondary | ICD-10-CM | POA: Diagnosis not present

## 2021-09-14 DIAGNOSIS — K5900 Constipation, unspecified: Secondary | ICD-10-CM | POA: Diagnosis not present

## 2021-09-14 LAB — COMPREHENSIVE METABOLIC PANEL
ALT: 18 U/L (ref 0–44)
AST: 17 U/L (ref 15–41)
Albumin: 2.8 g/dL — ABNORMAL LOW (ref 3.5–5.0)
Alkaline Phosphatase: 88 U/L (ref 38–126)
Anion gap: 9 (ref 5–15)
BUN: 14 mg/dL (ref 8–23)
CO2: 26 mmol/L (ref 22–32)
Calcium: 9.6 mg/dL (ref 8.9–10.3)
Chloride: 101 mmol/L (ref 98–111)
Creatinine, Ser: 0.57 mg/dL — ABNORMAL LOW (ref 0.61–1.24)
GFR, Estimated: >60 mL/min (ref >60)
Glucose, Bld: 135 mg/dL — ABNORMAL HIGH (ref 70–99)
Potassium: 3.1 mmol/L — ABNORMAL LOW (ref 3.5–5.1)
Sodium: 136 mmol/L (ref 135–145)
Total Bilirubin: 1.3 mg/dL — ABNORMAL HIGH (ref 0.3–1.2)
Total Protein: 7.1 g/dL (ref 6.5–8.1)

## 2021-09-14 LAB — CBC WITH DIFFERENTIAL/PLATELET
Abs Immature Granulocytes: 0.34 10*3/uL — ABNORMAL HIGH (ref 0.00–0.07)
Basophils Absolute: 0.1 10*3/uL (ref 0.0–0.1)
Basophils Relative: 1 %
Eosinophils Absolute: 0.2 10*3/uL (ref 0.0–0.5)
Eosinophils Relative: 1 %
HCT: 38.9 % — ABNORMAL LOW (ref 39.0–52.0)
Hemoglobin: 12.9 g/dL — ABNORMAL LOW (ref 13.0–17.0)
Immature Granulocytes: 2 %
Lymphocytes Relative: 6 %
Lymphs Abs: 1.2 10*3/uL (ref 0.7–4.0)
MCH: 29.1 pg (ref 26.0–34.0)
MCHC: 33.2 g/dL (ref 30.0–36.0)
MCV: 87.6 fL (ref 80.0–100.0)
Monocytes Absolute: 1.5 10*3/uL — ABNORMAL HIGH (ref 0.1–1.0)
Monocytes Relative: 7 %
Neutro Abs: 18.2 10*3/uL — ABNORMAL HIGH (ref 1.7–7.7)
Neutrophils Relative %: 83 %
Platelets: 247 10*3/uL (ref 150–400)
RBC: 4.44 MIL/uL (ref 4.22–5.81)
RDW: 12.9 % (ref 11.5–15.5)
WBC: 21.7 10*3/uL — ABNORMAL HIGH (ref 4.0–10.5)
nRBC: 0 % (ref 0.0–0.2)

## 2021-09-14 LAB — MAGNESIUM: Magnesium: 2 mg/dL (ref 1.7–2.4)

## 2021-09-14 LAB — GLUCOSE, CAPILLARY
Glucose-Capillary: 151 mg/dL — ABNORMAL HIGH (ref 70–99)
Glucose-Capillary: 193 mg/dL — ABNORMAL HIGH (ref 70–99)
Glucose-Capillary: 111 mg/dL — ABNORMAL HIGH (ref 70–99)

## 2021-09-14 LAB — PHOSPHORUS: Phosphorus: 2.4 mg/dL — ABNORMAL LOW (ref 2.5–4.6)

## 2021-09-14 MED ORDER — K PHOS MONO-SOD PHOS DI & MONO 155-852-130 MG PO TABS
500.0000 mg | ORAL_TABLET | Freq: Once | ORAL | Status: AC
  Administered 2021-09-14: 500 mg via ORAL
  Filled 2021-09-14: qty 2

## 2021-09-14 MED ORDER — BISACODYL 10 MG RE SUPP
10.0000 mg | Freq: Once | RECTAL | Status: AC
  Administered 2021-09-14: 10 mg via RECTAL
  Filled 2021-09-14: qty 1

## 2021-09-14 MED ORDER — POTASSIUM CHLORIDE CRYS ER 20 MEQ PO TBCR
40.0000 meq | EXTENDED_RELEASE_TABLET | Freq: Once | ORAL | Status: AC
  Administered 2021-09-14: 40 meq via ORAL
  Filled 2021-09-14: qty 2

## 2021-09-14 MED ORDER — POLYETHYLENE GLYCOL 3350 17 G PO PACK
17.0000 g | PACK | Freq: Once | ORAL | Status: AC
Start: 2021-09-14 — End: 2021-09-14
  Administered 2021-09-14: 17 g via ORAL
  Filled 2021-09-14: qty 1

## 2021-09-14 NOTE — Assessment & Plan Note (Addendum)
-  Patient's Hgb/Hct went from 15.8/46.9 -> 13.3/40.7 -> 12.9/39.2 -> 12.9/38.9 -> 12.7/38.1 and is stable ?-Likely dilutional Drop ?-Check Anemia Panel in the AM in the outpatient setting ?-Continue to Monitor for S/Sx of Bleeding; No overt bleeding  ?-Repeat CBC in the AM  ?

## 2021-09-14 NOTE — Assessment & Plan Note (Addendum)
-  Patient's K+ Level was 3.1 ?-Replete with po K Phos Neutral 500 mg x1 and po KCl 40 mEQ BID X2 yesterday ?-Continue to Monitor and Replete as Necessary ?-Repeat CMP within 1 week ?

## 2021-09-14 NOTE — Progress Notes (Signed)
?PROGRESS NOTE ? ? ? ?Angel MountGregory Berry  NWG:[id number] DOB: 07-03-1960 DOA: 09/11/2021 ?PCP: Pcp, No  ? ?Brief Narrative:  ?Angel MountGregory Labat is a 61 y.o. male with no real past medical history who does not regularly follow-up with a physician who presented with lower abdominal pain that was diffuse across the lower abdomen.  Since that time he had frequency of stools, episodes of vomiting and no relief with over-the-counter medication.  He has a prior cholecystectomy but still has his appendix.  Further work-up was done and showed acute appendicitis versus potential surrounding inflammation with questionable perforation.  General surgery consulted and they admitted the patient and he underwent laparoscopic cholecystectomy.  Preoperatively he was given a dose of Decadron but her blood sugars were elevated even prior to the steroid injection.  Hemoglobin A1c was checked and he was found to be newly diabetic with a hemoglobin A1c of 7.6.  TRH was asked to manage this patient new onset diabetes and provide further recommendations. ?  ?Currently patient denies any complaints and denies any nausea, vomiting, abdominal pain patient complains of soreness.  Denies any chest pain, dysuria, increased frequency or any diarrhea. ? ?**Interim History ?He is doing well and WBC trending down. Appetite is poor and still has not had a BM. He ambulated in the [place] and did not Desaturate. Stable from a Medical Perspective and Blood Sugar is reasonably well controlled. IVF were discontinued given his worsening Edema. His diet is being advanced and he is tolerating it well. ? ?09/14/21: Still has not had a BM but he overall feels improved and is passing flatus. Not complaining of any real abdominal pain. WBC is still mildly elevated.   ? ? ?Assessment and Plan: ?* Acute perforated appendicitis ?-Per primary team  ?-Underwent a laparoscopic cholecystectomy and is currently on IV Zosyn for antibiotic coverage and will continue  ?-Received  IV fluids and have now discontinued given 1+ LE edema  ?-Pain control with hydromorphone 0.5 mg every 4 as needed for severe pain as well as oxycodone 5 to 10 mg every 4 as needed for moderate pain ?-DVT prophylaxis per surgical recommendations and he is on enoxaparin 40 mg subcu ? ? ?Normocytic anemia ?-Patient's Hgb/Hct went from 15.8/46.9 -> 13.3/40.7 -> 12.9/39.2 -> 12.9/38.9 ?-Likely dilutional Drop ?-Check Anemia Panel in the AM if still here ?-Continue to Monitor for S/Sx of Bleeding; No overt bleeding  ?-Repeat CBC in the AM  ? ?Hyperbilirubinemia ?-T Bili went from 1.4 -> 1.3 ?-Likely reactive ?-Continue to Monitor and Trend ?-Repeat CMP in the AM if not discharged and if discharged repeat within 1 week  ? ?Hypophosphatemia ?-Patient's Phos Level is now 2.4 ?-Replete with po K Phos Neutral 500 mg x1 ?-Continue to Monitor and Replete as Necessary ?-Repeat Phos Level in the AM if not discharged but can be checked in a week ? ?Hypokalemia ?-Patient's Phos Level is 3.1 ?-Replete with po K Phos Neutral 500 mg x1 and po KCl 40 mEQ BID X2  ?-Continue to Monitor and Replete as Necessary ?-Repeat CMP in the AM if not discharged and within 1 week  ? ?Constipation ?-Likely Related to Opiates ?-Agree with Bisacodyl Suppository 10 mg RC x1 ?-Added Senna-Docuaste 1 tab po Bid and he received a dose of po Miralax 17 grams x1 this AM ?-Continue to Monitor and still has not had a BM but is passing flatus  ? ?Hyponatremia ?-Mild and Improving ?-Na+ went from 130 -> 134 -> 136 ?-Received IVF Hydration and now stopped  ?-  Continue to Monitor and Trend and Repeat CMP in the AM  ? ?Leukocytosis ?- Initially in the setting of appendicitis but then worsened in the setting of steroid demargination and is now gone from 24.5 -> 33.2 -> 20.2 -> 21.7 ?-Currently getting IV antibiotics with Zosyn and will defer further Abx Management to General Surgery ?-Continue to monitor for signs and symptoms of infection ?-Repeat CBC within 1 week  at D/C  ? ?Obesity, Class III, BMI 40-49.9 (morbid obesity) (HCC) ?-Complicates overall prognosis and care ?-Estimated body mass index is 41.5 kg/m? as calculated from the following: ?  Height as of this encounter: 5\' 7"  (1.702 m). ?  Weight as of this encounter: 120.2 kg.  ?-Weight Loss and Dietary Counseling given and he will need continued PCP follow-up for further weight management ? ?DM (diabetes mellitus) (HCC) ?- New onset and hemoglobin A1c 7.6. ?-We will need an outpatient diabetic foot examination as well as a eye examination ?-Received a Decadron injection for surgical intervention ?-CBGs ranging from 126-155 ?-Agree with current inpatient recommendations with the sliding scale ?-Have also added metformin 500 g p.o. twice daily and will continue at discharge ?-Patient will require glucometer and close PCP follow-up for further blood sugar management ?-He was extensively counseled about diabetic progression and adequate blood sugar management and he states he is willing to make lifestyle changes. ?-Follow blood sugar trend closely ?-May be a candidate for Ozempic but will not start here and defer to PCP to evaluate ?-Diabetic education coordinator consulted and following as well and he will need outpatient diabetic coordinator follow-up as well ? ? ? ?Acute appendicitis ?-See above ? ?DVT prophylaxis: enoxaparin (LOVENOX) injection 40 mg Start: 09/11/21 1700 ?SCDs Start: 09/11/21 1647 ? ?  Code Status: Full Code ?Family Communication: No family present at bedside  ? ?Disposition Plan:  ?Level of care: Med-Surg ?Status is: Inpatient ?Remains inpatient appropriate because: WBC is elevated and still has not had a BM yet. ?  ?Consultants:  ?TRH ?General Surgery is Primary  ? ?Procedures:  ?Laparoscopic Appendectomy  ? ?Antimicrobials:  ?Anti-infectives (From admission, onward)  ? ? Start     Dose/Rate Route Frequency Ordered Stop  ? 09/11/21 1700  piperacillin-tazobactam (ZOSYN) IVPB 3.375 g       ? 3.375  g ?12.5 mL/hr over 240 Minutes Intravenous Every 8 hours 09/11/21 1646 09/18/21 1359  ? 09/11/21 1245  piperacillin-tazobactam (ZOSYN) IVPB 3.375 g       ? 3.375 g ?100 mL/hr over 30 Minutes Intravenous  Once 09/11/21 1235 09/11/21 1643  ? ?  ?  ?Subjective: ?Seen and examined at bedside and he is doing okay and sitting on a bench at bedside and not complaining of anything.  States he tolerated his diet without issues but does not want to eat his grades.  States that he is passing flatus but does not have a bowel movement yet.  Has been ambulating.  States his abdominal discomfort is as bad.  No other concerns or complaints at this time. ? ?Objective: ?Vitals:  ? 09/13/21 1132 09/13/21 1219 09/13/21 2046 09/14/21 0441  ?BP:  (!) 144/77 (!) 147/86 (!) 153/81  ?Pulse:  (!) 101 (!) 103 (!) 102  ?Resp:  20 18 18   ?Temp:  98.7 ?F (37.1 ?C) 98.2 ?F (36.8 ?C)   ?TempSrc:  Oral    ?SpO2: 93% 94% 99% 94%  ?Weight:      ?Height:      ? ? ?Intake/Output Summary (Last 24 hours) at  09/14/2021 1141 ?Last data filed at 09/13/2021 1849 ?Gross per 24 hour  ?Intake 480 ml  ?Output 300 ml  ?Net 180 ml  ? ?Filed Weights  ? 09/11/21 0938  ?Weight: 120.2 kg  ? ?Examination: ?Physical Exam: ? ?Constitutional: WN/WD morbidly obese African-American male currently in no acute distress ?Respiratory: Diminished to auscultation bilaterally, no wheezing, rales, rhonchi or crackles. Normal respiratory effort and patient is not tachypenic. No accessory muscle use. Unlabored breathing  ?Cardiovascular: RRR, no murmurs / rubs / gallops. S1 and S2 auscultated. 1+ LE edema ?Abdomen: Soft, non-tender, Distended 2/2 body habitus. Bowel sounds positive.  ?GU: Deferred. ?Musculoskeletal: No clubbing / cyanosis of digits/nails. No joint deformity upper and lower extremities.  ?Neurologic: CN 2-12 grossly intact with no focal deficits.  ?Psychiatric: Normal judgment and insight. Alert and oriented x 3. Normal mood and appropriate affect.  ? ?Data Reviewed: I  have personally reviewed following labs and imaging studies ? ?CBC: ?Recent Labs  ?Lab 09/11/21 ?1610 09/12/21 ?9604 09/13/21 ?5409 09/14/21 ?8119  ?WBC 24.5* 33.2* 20.2* 21.7*  ?NEUTROABS  --   --   --  18

## 2021-09-14 NOTE — Progress Notes (Signed)
[facility] Progress Note ? ?3 Days Post-Op  ?Subjective: ?Patient seen and examined.  He walking around the room, about to wash himself.  He is tolerating his soft diet without nausea and vomiting.  He has abdominal cramping when he attempts to get out of bed, but otherwise his pain is adequately controlled.  He has not had a bowel movement yet.  He denies fevers and chills. ? ?Objective: ?Vital signs in last 24 hours: ?Temp:  [98.2 ?F (36.8 ?C)] 98.2 ?F (36.8 ?C) (03/25 2046) ?Pulse Rate:  [102-103] 102 (03/26 0441) ?Resp:  [18] 18 (03/26 0441) ?BP: (147-153)/(81-86) 153/81 (03/26 0441) ?SpO2:  [94 %-99 %] 94 % (03/26 0441) ?  ? ?Intake/Output from previous day: ?03/25 0701 - 03/26 0700 ?In: 600 [P.O.:600] ?Out: 300 [Urine:300] ?Intake/Output this shift: ?No intake/output data recorded. ? ?General appearance: alert, cooperative, and no distress ?GI: Soft, nondistended, no percussion tenderness, minimal lower abdominal tenderness to palpation, no rigidity, guarding, rebound tenderness; incisions C/D/I with skin glue in place ? ?Lab Results:  ?Recent Labs  ?  09/13/21 ?0647 09/14/21 ?0623  ?WBC 20.2* 21.7*  ?HGB 12.9* 12.9*  ?HCT 39.2 38.9*  ?PLT 184 247  ? ?BMET ?Recent Labs  ?  09/12/21 ?0623 09/14/21 ?7628  ?NA 134* 136  ?K 3.7 3.1*  ?CL 100 101  ?CO2 25 26  ?GLUCOSE 163* 135*  ?BUN 19 14  ?CREATININE 0.79 0.57*  ?CALCIUM 9.2 9.6  ? ?PT/INR ?No results for input(s): LABPROT, INR in the last 72 hours. ? ?Studies/Results: ?No results found. ? ?Anti-infectives: ?Anti-infectives (From admission, onward)  ? ? Start     Dose/Rate Route Frequency Ordered Stop  ? 09/11/21 1700  piperacillin-tazobactam (ZOSYN) IVPB 3.375 g       ? 3.375 g ?12.5 mL/hr over 240 Minutes Intravenous Every 8 hours 09/11/21 1646 09/18/21 1359  ? 09/11/21 1245  piperacillin-tazobactam (ZOSYN) IVPB 3.375 g       ? 3.375 g ?100 mL/hr over 30 Minutes Intravenous  Once 09/11/21 1235 09/11/21 1643  ? ?   ? ? ?Assessment/Plan: ? ?Patient is a 61 year old male who was admitted status post laparoscopic appendectomy on 3/23 for acute perforated and gangrenous appendicitis. ? ?-Leukocytosis increased to 21.7 from 20.2 yesterday ?-Patient also mildly tachycardic this morning at 103 ?-Continue Zosyn ?-GI soft diet ?-MiraLAX and Dulcolax suppository ordered ?-As needed pain control and antiemetics ?-Sliding scale insulin for blood sugars ?-I discussed possible discharge with the patient, and we have agreed that we will keep him 1 additional day to verify that his white blood cell count is downtrending given that it did uptrend again today. ? ? LOS: 3 days  ? ? ?Katielynn Horan A Safiyah Cisney ?09/14/2021 ? ?

## 2021-09-14 NOTE — Assessment & Plan Note (Addendum)
-  Patient's Phos Level is now 2.4 on last check ?-Replete with po K Phos Neutral 500 mg x1 yesterday ?-Continue to Monitor and Replete as Necessary ?-Repeat Phos Level within 1 week  ?

## 2021-09-14 NOTE — Assessment & Plan Note (Addendum)
-  T Bili went from 1.4 -> 1.3 on last check  ?-Likely reactive ?-Continue to Monitor and Trend ?-Repeat CMP within 1 week  ?

## 2021-09-15 ENCOUNTER — Encounter (HOSPITAL_COMMUNITY): Payer: Self-pay | Admitting: Surgery

## 2021-09-15 DIAGNOSIS — E871 Hypo-osmolality and hyponatremia: Secondary | ICD-10-CM | POA: Diagnosis not present

## 2021-09-15 DIAGNOSIS — K3532 Acute appendicitis with perforation and localized peritonitis, without abscess: Secondary | ICD-10-CM | POA: Diagnosis not present

## 2021-09-15 DIAGNOSIS — K5900 Constipation, unspecified: Secondary | ICD-10-CM | POA: Diagnosis not present

## 2021-09-15 DIAGNOSIS — K352 Acute appendicitis with generalized peritonitis, without abscess: Secondary | ICD-10-CM | POA: Diagnosis not present

## 2021-09-15 LAB — CBC
HCT: 38.1 % — ABNORMAL LOW (ref 39.0–52.0)
Hemoglobin: 12.7 g/dL — ABNORMAL LOW (ref 13.0–17.0)
MCH: 29.7 pg (ref 26.0–34.0)
MCHC: 33.3 g/dL (ref 30.0–36.0)
MCV: 89.2 fL (ref 80.0–100.0)
Platelets: 264 10*3/uL (ref 150–400)
RBC: 4.27 MIL/uL (ref 4.22–5.81)
RDW: 13 % (ref 11.5–15.5)
WBC: 18.4 10*3/uL — ABNORMAL HIGH (ref 4.0–10.5)
nRBC: 0 % (ref 0.0–0.2)

## 2021-09-15 LAB — GLUCOSE, CAPILLARY
Glucose-Capillary: 149 mg/dL — ABNORMAL HIGH (ref 70–99)
Glucose-Capillary: 130 mg/dL — ABNORMAL HIGH (ref 70–99)

## 2021-09-15 LAB — SURGICAL PATHOLOGY

## 2021-09-15 MED ORDER — DOCUSATE SODIUM 100 MG PO CAPS: 100.0000 mg | ORAL_CAPSULE | Freq: Two times a day (BID) | ORAL | 0 refills | Status: AC

## 2021-09-15 MED ORDER — AMOXICILLIN-POT CLAVULANATE 875-125 MG PO TABS: 1.0000 | ORAL_TABLET | Freq: Two times a day (BID) | ORAL | 0 refills | Status: AC

## 2021-09-15 MED ORDER — OXYCODONE HCL 5 MG PO TABS: 5.0000 mg | ORAL_TABLET | Freq: Four times a day (QID) | ORAL | 0 refills | Status: DC | PRN

## 2021-09-15 MED ORDER — ACETAMINOPHEN 500 MG PO TABS: 1000.0000 mg | ORAL_TABLET | Freq: Four times a day (QID) | ORAL | 0 refills | Status: AC

## 2021-09-15 NOTE — Progress Notes (Signed)
?PROGRESS NOTE ? ? ? ?Angel Berry  KXF:[id number] DOB: 14-Jan-1961 DOA: 09/11/2021 ?PCP: Pcp, No  ? ?Brief Narrative:  ?Angel Berry is a 61 y.o. male with no real past medical history who does not regularly follow-up with a physician who presented with lower abdominal pain that was diffuse across the lower abdomen.  Since that time he had frequency of stools, episodes of vomiting and no relief with over-the-counter medication.  He has a prior cholecystectomy but still has his appendix.  Further work-up was done and showed acute appendicitis versus potential surrounding inflammation with questionable perforation.  General surgery consulted and they admitted the patient and he underwent laparoscopic cholecystectomy.  Preoperatively he was given a dose of Decadron but her blood sugars were elevated even prior to the steroid injection.  Hemoglobin A1c was checked and he was found to be newly diabetic with a hemoglobin A1c of 7.6.  TRH was asked to manage this patient new onset diabetes and provide further recommendations. ?  ?Currently patient denies any complaints and denies any nausea, vomiting, abdominal pain patient complains of soreness.  Denies any chest pain, dysuria, increased frequency or any diarrhea. ? ?**Interim History ?He is doing well and WBC trending down. Appetite is poor and still has not had a BM. He ambulated in the [place] and did not Desaturate. Stable from a Medical Perspective and Blood Sugar is reasonably well controlled. IVF were discontinued given his worsening Edema. His diet is being advanced and he is tolerating it well. ? ?09/14/21: Still has not had a BM but he overall feels improved and is passing flatus. Not complaining of any real abdominal pain. WBC is still mildly elevated.  ? ?09/15/21: Patient was seen and he states that he has had several bowel movements now.  States that he did not really want to eat the dinner last night because he did not like it but ate his breakfast and  states it was a good breakfast.  Ambulating well and WBC is trended down to 18.7.  Patient can be discharged from a medical perspective given that he is overall stable and will need repeat lab work within 1 week.  We will defer antibiotic management to the surgery team and he will need to follow-up and establish with a PCP and have further weight loss counseling and dietary counseling given in the outpatient setting.  We will discharge the patient home on metformin and a glucometer and defer to PCP to further titrate medications and add medications.  TRH will sign off at this point do not hesitate to call us back for any concerns or questions.  ? ? ?Assessment and Plan: ?* Acute perforated appendicitis ?-Per primary team  ?-Underwent a laparoscopic cholecystectomy and is currently on IV Zosyn for antibiotic coverage and will continue  ?-Received IV fluids and have now discontinued given 1+ LE edema  ?-Pain control with hydromorphone 0.5 mg every 4 as needed for severe pain as well as oxycodone 5 to 10 mg every 4 as needed for moderate pain ?-DVT prophylaxis per surgical recommendations and he is on enoxaparin 40 mg subcu ? ? ?Normocytic anemia ?-Patient's Hgb/Hct went from 15.8/46.9 -> 13.3/40.7 -> 12.9/39.2 -> 12.9/38.9 -> 12.7/38.1 and is stable ?-Likely dilutional Drop ?-Check Anemia Panel in the AM in the outpatient setting ?-Continue to Monitor for S/Sx of Bleeding; No overt bleeding  ?-Repeat CBC in the AM  ? ?Hyperbilirubinemia ?-T Bili went from 1.4 -> 1.3 on last check  ?-Likely reactive ?-Continue to Monitor  and Trend ?-Repeat CMP within 1 week  ? ?Hypophosphatemia ?-Patient's Phos Level is now 2.4 on last check ?-Replete with po K Phos Neutral 500 mg x1 yesterday ?-Continue to Monitor and Replete as Necessary ?-Repeat Phos Level within 1 week  ? ?Hypokalemia ?-Patient's K+ Level was 3.1 ?-Replete with po K Phos Neutral 500 mg x1 and po KCl 40 mEQ BID X2 yesterday ?-Continue to Monitor and Replete as  Necessary ?-Repeat CMP within 1 week ? ?Constipation ?-Likely Related to Opiates ?-Agree with Bisacodyl Suppository 10 mg RC x1 ?-Added Senna-Docuaste 1 tab po Bid and he received a dose of po Miralax 17 grams x1 this AM ?-Continue to Monitor and still has not had a BM but is passing flatus  ? ?Hyponatremia ?-Mild and Improving ?-Na+ went from 130 -> 134 -> 136 ?-Received IVF Hydration and now stopped  ?-Continue to Monitor and Trend and Repeat CMP in the AM  ? ?Leukocytosis ?- Initially in the setting of appendicitis but then worsened in the setting of steroid demargination and is now gone from 24.5 -> 33.2 -> 20.2 -> 21.7 -> 18.4 ?-Currently getting IV antibiotics with Zosyn and will defer further Abx Management to General Surgery ?-Continue to monitor for signs and symptoms of infection ?-Repeat CBC within 1 week at D/C  ? ?Obesity, Class III, BMI 40-49.9 (morbid obesity) (HCC) ?-Complicates overall prognosis and care ?-Estimated body mass index is 41.5 kg/m? as calculated from the following: ?  Height as of this encounter: 5\' 7"  (1.702 m). ?  Weight as of this encounter: 120.2 kg.  ?-Weight Loss and Dietary Counseling given and he will need continued PCP follow-up for further weight management ? ?DM (diabetes mellitus) (HCC) ?- New onset and hemoglobin A1c 7.6. ?-We will need an outpatient diabetic foot examination as well as a eye examination ?-Received a Decadron injection for surgical intervention ?-CBGs ranging from 111-193 ?-Agree with current inpatient recommendations with the sliding scale ?-Have also added metformin 500 g p.o. twice daily and will continue at discharge ?-Patient will require glucometer and close PCP follow-up for further blood sugar management ?-He was extensively counseled about diabetic progression and adequate blood sugar management and he states he is willing to make lifestyle changes. ?-Follow blood sugar trend closely ?-May be a candidate for Ozempic but will not start here and  defer to PCP to evaluate ?-Diabetic education coordinator consulted and following as well and he will need outpatient diabetic coordinator follow-up as well ? ? ? ?Acute appendicitis ?-See above ? ?DVT prophylaxis: enoxaparin (LOVENOX) injection 40 mg Start: 09/11/21 1700 ?SCDs Start: 09/11/21 1647 ? ?  Code Status: Full Code ?Family Communication: No family present at bedside  ? ?Disposition Plan:  ?Level of care: Med-Surg ?Status is: Inpatient - Currently is Medically stable to D/C   ? ?Consultants:  ?TRH ?General Surgery is Primary ? ?Procedures:  ?Laparoscopic Appendectomy  ? ?Antimicrobials:  ?Anti-infectives (From admission, onward)  ? ? Start     Dose/Rate Route Frequency Ordered Stop  ? 09/11/21 1700  piperacillin-tazobactam (ZOSYN) IVPB 3.375 g       ? 3.375 g ?12.5 mL/hr over 240 Minutes Intravenous Every 8 hours 09/11/21 1646 09/18/21 1359  ? 09/11/21 1245  piperacillin-tazobactam (ZOSYN) IVPB 3.375 g       ? 3.375 g ?100 mL/hr over 30 Minutes Intravenous  Once 09/11/21 1235 09/11/21 1643  ? ?  ?  ?Subjective: ?Seen and examined at bedside when he is sitting on the bench at bedside and  is felt okay.  States that he had several bowel movements and states the first 2 were loose but the last one he had was not as loose and had some solids in it.  Tolerated his diet without issues but did not eat his dinner because he did not like it.  Tolerated breakfast and ate well.  Complains of some "soreness" at his incision sites.  Has been ambulating and has no other concerns or complaints.  Continues to have some slight lower extremity edema but did receive IV fluids and was told to elevate his legs.  Denies any other concerns or complaints at this time. ? ?Objective: ?Vitals:  ? 09/14/21 0441 09/14/21 1423 09/14/21 2018 09/15/21 0706  ?BP: (!) 153/81 (!) 152/79 (!) 156/89 (!) 142/71  ?Pulse: (!) 102 100 97 86  ?Resp: 18 18 (!) 21 16  ?Temp:  98.6 ?F (37 ?C) 98.4 ?F (36.9 ?C) 97.9 ?F (36.6 ?C)  ?TempSrc:  Oral Oral  Oral  ?SpO2: 94% 95% 95% 93%  ?Weight:      ?Height:      ? ? ?Intake/Output Summary (Last 24 hours) at 09/15/2021 1129 ?Last data filed at 09/15/2021 0900 ?Gross per 24 hour  ?Intake 720 ml  ?Output --  ?Net 720

## 2021-09-15 NOTE — Discharge Instructions (Signed)
Ambulatory Surgery Discharge Instructions ? ?Activity ? You are advised to go directly home from the hospital.  Resume light activity. No heavy lifting over 10 lbs or strenuous exercise. ? ?Fluids and Diet ?Regular, heart healthy diet ? ?Medications ? If you have not had a bowel movement in 24 hours, take 2 tablespoons over the counter Milk of mag. ?            You May resume your blood thinners tomorrow (Aspirin, coumadin, or other).  ?You are being discharged with prescriptions for Opioid/Narcotic Medications: There are some specific considerations for these medications that you should know. ?Opioid Meds have risks & benefits. Addiction to these meds is always a concern with prolonged use ?Take medication only as directed ?Do not drive while taking narcotic pain medication ?Do not crush tablets or capsules ?Do not use a different container than medication was dispensed in ?Lock the container of medication in a cool, dry place out of reach of children and pets. ?Opioid medication can cause addiction ?Do not share with anyone else (this is a felony) ?Do not store medications for future use. Dispose of them properly. ?    Disposal:  ?Find a [employer] household drug take back site near you.  ?If you can't get to a drug take back site, use the recipe below as a last resort to dispose of expired, unused or unwanted drugs. ?Disposal  ?(Do not dispose chemotherapy drugs this way, talk to your prescribing doctor instead.) Step 1: Mix drugs (do not crush) with dirt, kitty litter, or used coffee grounds and add a small amount of water to dissolve any solid medications. Step 2: Seal drugs in plastic bag. Step 3: Place plastic bag in trash. Step 4: Take prescription container and scratch out personal information, then recycle or throw away. ? ?Operative Site ? You have a liquid bandage over your incisions, this will begin to flake off in about a week. Ok to English as a second language teacher. Keep wound clean and dry. No baths or swimming.  No lifting more than 10 pounds. ? ?Contact Information: ?If you have questions or concerns, please call our office, [phone], Monday- Thursday 8AM-5PM and Friday 8AM-12Noon.  ?If it is after hours or on the weekend, please call [facility] Main Number, [phone], and ask to speak to the surgeon on call for Dr. Robyne Peers at [facility].  ? ?SPECIFIC COMPLICATIONS TO WATCH FOR: ?Inability to urinate ?Fever over 101? F by mouth ?Nausea and vomiting lasting longer than 24 hours. ?Pain not relieved by medication ordered ?Swelling around the operative site ?Increased redness, warmth, hardness, around operative area ?Numbness, tingling, or cold fingers or toes ?Blood -soaked dressing, (small amounts of oozing may be normal) ?Increasing and progressive drainage from surgical area or exam site ? ?

## 2021-09-15 NOTE — Discharge Summary (Signed)
Physician Discharge Summary  ?Patient ID: ?Angel Berry ?MRN: [id number] ?DOB/AGE: [birth year] 61 y.o. ? ?Admit date: 09/11/2021 ?Discharge date: 09/15/2021 ? ?Admission Diagnoses: ? ?Discharge Diagnoses:  ?Principal Problem: ?  Acute perforated appendicitis ?Active Problems: ?  Acute appendicitis ?  DM (diabetes mellitus) ([place]) ?  Obesity, Class III, BMI 40-49.9 (morbid obesity) ([place]) ?  Leukocytosis ?  Hyponatremia ?  Constipation ?  Hypokalemia ?  Hypophosphatemia ?  Hyperbilirubinemia ?  Normocytic anemia ? ?Discharged Condition: stable ? ?Hospital Course: Patient is a 61 year old male who was admitted status post laparoscopic appendectomy for perforated and gangrenous acute appendicitis.  Postop, he was able to tolerate solid food without nausea and vomiting, urinate without issue, move his bowels, and ambulate the floors.  His leukocytosis initially went up immediately after surgery, but this was attributed to Decadron he received intraoperatively.  His leukocytosis started to downtrend, but he was not fully advanced to a solid diet yet, so he was maintained in the hospital.  He subsequently stayed another 2 days as his leukocytosis initially increased but then subsequently decreased to 18.4 from 21.7.  Patient is and he is now stable for discharge.  He will follow-up with me in 1 week.  He will be discharged with pain medications, and 2 days of antibiotics to complete his treatment.  He was also noted to have a new diagnosis of diabetes while in the hospital.  Internal medicine has ordered outpatient diabetes medications for him, and he has been given a list of primary care doctors in the surrounding area.  He was also given the information for a local endocrinologist for him to make an appointment. ? ?Consults:  Internal medicine for diabetes management ? ?Significant Diagnostic Studies: radiology: CT scan: Acute appendicitis with possible perforation ? ?Treatments: antibiotics: Zosyn, insulin: regular, and  surgery: Laparoscopic appendectomy ? ?Discharge Exam: ?Blood pressure (!) 146/87, pulse 86, temperature 98.2 ?F (36.8 ?C), temperature source Oral, resp. rate 20, height _0  (1.702 m), weight 120.2 kg, SpO2 96 %. ?General appearance: alert, cooperative, and no distress ?Resp: Appropriate respiratory effort, equal rise of chest bilaterally ?Cardio: regular rate and rhythm ?GI: Abdomen is soft, nondistended, no percussion tenderness, mild tenderness to palpation in the lower abdomen, no rigidity, guarding, rebound tenderness; incisions C/D/I with skin glue in place ? ?Disposition: Discharge disposition: 01-Home or Self Care ? ? ? ? ? ? ?Discharge Instructions   ? ? Ambulatory referral to Nutrition and Diabetic Education   Complete by: As directed ?  ? Call MD for:  difficulty breathing, headache or visual disturbances   Complete by: As directed ?  ? Call MD for:  extreme fatigue   Complete by: As directed ?  ? Call MD for:  hives   Complete by: As directed ?  ? Call MD for:  persistant dizziness or light-headedness   Complete by: As directed ?  ? Call MD for:  persistant nausea and vomiting   Complete by: As directed ?  ? Call MD for:  redness, tenderness, or signs of infection (pain, swelling, redness, odor or green/yellow discharge around incision site)   Complete by: As directed ?  ? Call MD for:  severe uncontrolled pain   Complete by: As directed ?  ? Call MD for:  temperature >100.4   Complete by: As directed ?  ? Diet - low sodium heart healthy   Complete by: As directed ?  ? Increase activity slowly   Complete by: As directed ?  ? ?  ? ?  Allergies as of 09/15/2021   ?No Known Allergies ?  ? ?  ?Medication List  ?  ? ?TAKE these medications   ? ?acetaminophen 500 MG tablet ?Commonly known as: TYLENOL ?Take 2 tablets (1,000 mg total) by mouth every 6 (six) hours for 7 days. ?  ?amoxicillin-clavulanate 875-125 MG tablet ?Commonly known as: Augmentin ?Take 1 tablet by mouth 2 (two) times daily for 2 days. ?   ?blood glucose meter kit and supplies Kit ?Dispense based on patient and insurance preference. Use up to four times daily as directed. ?  ?docusate sodium 100 MG capsule ?Commonly known as: Colace ?Take 1 capsule (100 mg total) by mouth 2 (two) times daily for 14 days. ?  ?metFORMIN 500 MG tablet ?Commonly known as: Glucophage ?Take 1 tablet (500 mg total) by mouth 2 (two) times daily with a meal. ?  ?oxyCODONE 5 MG immediate release tablet ?Commonly known as: Oxy IR/ROXICODONE ?Take 1 tablet (5 mg total) by mouth every 6 (six) hours as needed for moderate pain. ?  ? ?  ? ? Follow-up Information   ? ? Dorlisa Savino, Flint Melter, DO. Call.   ?Specialty: General Surgery ?Why: Call to schedule an appointment with me next week. ?Contact information: ?[address] ?[place] [address] ?[phone] ? ? ?  ?  ? ? Cassandria Anger, MD. Schedule an appointment as soon as possible for a visit in 1 week(s).   ?Specialty: Endocrinology ?Why: Call to schedule an appointment for management of your diabetes ?Contact information: ?[address] ?[place] [address] ?[phone] ? ? ?  ?  ? ?  ?  ? ?  ? ? ?Signed: ?Keygan Dumond A Derrika Ruffalo ?09/15/2021, 3:26 PM ? ? ?

## 2021-09-15 NOTE — TOC Progression Note (Signed)
Transition of Care (TOC) - Progression Note  ? ? ?Patient Details  ?Name: [name] ?MRN: [id number] ?Date of Birth: 1960/12/04 ? ?Transition of Care (TOC) CM/SW Contact  ?Karn Cassis, LCSW ?Phone Number: ?09/15/2021, 10:42 AM ? ?Clinical Narrative:  TOC received consult for PCP list. Discussed with pt who confirms he does not have PCP. Will provide PCP list. No other needs reported. TOC will continue to follow.   ? ? ? ?  ?  ? ?Expected Discharge Plan and Services ?  ?  ?  ?  ?  ?                ?  ?  ?  ?  ?  ?  ?  ?  ?  ?  ? ? ?Social Determinants of Health (SDOH) Interventions ?  ? ?Readmission Risk Interventions ?   ? View : No data to display.  ?  ?  ?  ? ? ?

## 2021-09-23 ENCOUNTER — Other Ambulatory Visit: Payer: Self-pay

## 2021-09-23 ENCOUNTER — Ambulatory Visit (INDEPENDENT_AMBULATORY_CARE_PROVIDER_SITE_OTHER): Payer: BC Managed Care – PPO | Admitting: Surgery

## 2021-09-23 ENCOUNTER — Encounter: Payer: Self-pay | Admitting: Surgery

## 2021-09-23 VITALS — BP 134/79 | HR 87 | Temp 97.1°F | Resp 16 | Ht 67.0 in | Wt 277.0 lb

## 2021-09-23 DIAGNOSIS — Z09 Encounter for follow-up examination after completed treatment for conditions other than malignant neoplasm: Secondary | ICD-10-CM

## 2021-09-23 LAB — CBC WITH DIFFERENTIAL/PLATELET
Absolute Monocytes: 1086 cells/uL — ABNORMAL HIGH (ref 200–950)
Basophils Absolute: 89 cells/uL (ref 0–200)
Basophils Relative: 0.5 %
Eosinophils Absolute: 71 cells/uL (ref 15–500)
Eosinophils Relative: 0.4 %
HCT: 39.2 % (ref 38.5–50.0)
Hemoglobin: 12.7 g/dL — ABNORMAL LOW (ref 13.2–17.1)
Lymphs Abs: 1620 cells/uL (ref 850–3900)
MCH: 28.3 pg (ref 27.0–33.0)
MCHC: 32.4 g/dL (ref 32.0–36.0)
MCV: 87.5 fL (ref 80.0–100.0)
MPV: 11.8 fL (ref 7.5–12.5)
Monocytes Relative: 6.1 %
Neutro Abs: 14934 cells/uL — ABNORMAL HIGH (ref 1500–7800)
Neutrophils Relative %: 83.9 %
Platelets: 363 10*3/uL (ref 140–400)
RBC: 4.48 10*6/uL (ref 4.20–5.80)
RDW: 12.3 % (ref 11.0–15.0)
Total Lymphocyte: 9.1 %
WBC: 17.8 10*3/uL — ABNORMAL HIGH (ref 3.8–10.8)

## 2021-09-24 NOTE — Progress Notes (Signed)
[facility] Note  ? ?HPI:  ?61 y.o. Male presents to clinic for post-op follow-up status post laparoscopic appendectomy on 3/23 for perforated and gangrenous acute appendicitis.  He states that he has been doing well since going home.  He is tolerating a diet without nausea and vomiting, and is moving his bowels.  He did have some difficulty moving his bowels, but states that he was taking his opioid pain medication until this past weekend.  His last bowel movement was yesterday and it was large and normal for him.  He denies fevers and chills.  His abdominal pain is well controlled.  He plans to schedule his follow-up appointment with a primary care doctor for evaluation of his diabetes.  His only complaint is sweating at night. ? ?Review of Systems:  ?All other review of systems: otherwise negative  ? ?Vital Signs:  ?BP 134/79   Pulse 87   Temp (!) 97.1 ?F (36.2 ?C) (Other (Comment))   Resp 16   Ht 5\' 7"  (1.702 m)   Wt 277 lb (125.6 kg)   SpO2 93%   BMI 43.38 kg/m?   ? ?Physical Exam:  ?Physical Exam ?Vitals reviewed.  ?Constitutional:   ?   Appearance: Normal appearance.  ?Abdominal:  ?   Comments: Abdomen soft, nondistended, no percussion tenderness, nontender to palpation, incisions C/D/I, some with skin glue in place, others healing well  ?Neurological:  ?   Mental Status: He is alert.  ? ?Laboratory studies: None  ? ?Imaging:  ?None  ? ?Assessment:  ?61 y.o. yo Male who presents for follow-up status post laparoscopic appendectomy on 3/23 for perforated and gangrenous acute appendicitis. ? ?Plan:  ?-Patient doing well from a surgical standpoint-tolerating diet and moving his bowels ?-Advised patient that he needs to make a follow-up appointment with a primary care doctor or endocrinologist for evaluation of his diabetes ?-Will order CBC given episodes of night sweats and his WBC being elevated at discharge (though downtrending) ?-Follow up with me as needed ? ?All of the above  recommendations were discussed with the patient and patient's family, and all of patient's and family's questions were answered to their expressed satisfaction. ? ?4/23, DO ?[facility] ?[address] ?[place], [place] ?[phone] (office) ? ?

## 2021-09-29 ENCOUNTER — Telehealth (INDEPENDENT_AMBULATORY_CARE_PROVIDER_SITE_OTHER): Payer: BC Managed Care – PPO | Admitting: Surgery

## 2021-09-29 ENCOUNTER — Telehealth: Payer: Self-pay | Admitting: *Deleted

## 2021-09-29 DIAGNOSIS — D72828 Other elevated white blood cell count: Secondary | ICD-10-CM

## 2021-09-29 DIAGNOSIS — R1084 Generalized abdominal pain: Secondary | ICD-10-CM

## 2021-09-29 NOTE — Telephone Encounter (Signed)
CT Abdomen Pelvis W/ Contrast- Outpatient ?Date: 09/29/2021 ?[employer]~ [phone]~ telephone ?CPT [id number] Dx: R10.84, D72.828 ?PA submitted/ approved ?Approval: [id number] ?09/29/2021- 03/28/2022 ?[employer], LPN/ RSA  ? ?Imaging Scheduled 10/07/2021 @ 2:30pm ?Arrive at 2:15pm.  ?[address], [address]  ?[place], [place] ? ?NPO x4 hours ?Oral Contrast x2.  ?

## 2021-09-29 NOTE — Telephone Encounter (Signed)
Called patient to discuss the results of his CBC.  Patient was discharged from the hospital with a leukocytosis of 18.4, and on postoperative evaluation, he had a CBC which demonstrated a continued leukocytosis of 17.8.  Given this persistent leukocytosis, I discussed with the patient that I would like to obtain a CT abdomen and pelvis to evaluate for any fluid collections or issues related to his appendicitis, as I would expect his white blood cell count to have normalized at this point.  The patient denies any nausea or vomiting, he is tolerating a diet, and he is moving his bowels without issue.  He denies significant abdominal pain.  He is having night sweats, but he is unsure if this is related to his new metformin.  He is going out of town tomorrow 4/11 until Friday 4/14, so I explained that we could obtain a CT abdomen and pelvis once he has returned (given that he is asymptomatic).  I advised him to call the office or present to the hospital if he begins having fevers, chills, worsening abdominal pain, nausea, vomiting, or obstipation.  All questions were answered to his expressed satisfaction. ? ?CT abdomen and pelvis with IV and oral contrast has been ordered.  The office will call him to schedule this imaging after obtaining prior authorization with his insurance company. ? ?Graciella Freer, DO ?[facility] ?[place], [place] [address] ?[phone] (office) ? ?

## 2021-10-03 NOTE — Telephone Encounter (Signed)
Received call from [facility] with [facility].  ? ?Advised that CT approved under authorization [id number] is only good for [facility] facilities under the NPI [id number]. ? ?Imaging re-scheduled for an approved facility.  ? ?10/07/2021 @ 3 pm. Arrive at 2: 45 pm.  ?[facility] ?[address], [place], [place] [address] ?[phone]~ telephone. ? ?NPO x4 hours ?Oral Contrast x2.  ? ?Call placed to [facility] and message left on VM that imaging was re-scheduled.  ?

## 2021-10-06 ENCOUNTER — Ambulatory Visit: Payer: BC Managed Care – PPO | Admitting: General Surgery

## 2021-10-06 NOTE — Telephone Encounter (Signed)
Call placed to patient and patient made aware.  

## 2021-10-07 ENCOUNTER — Ambulatory Visit: Payer: BC Managed Care – PPO

## 2021-10-07 ENCOUNTER — Ambulatory Visit (HOSPITAL_BASED_OUTPATIENT_CLINIC_OR_DEPARTMENT_OTHER)
Admission: RE | Admit: 2021-10-07 | Discharge: 2021-10-07 | Disposition: A | Discharge status: Home / Self Care | Payer: BC Managed Care – PPO | Source: Ambulatory Visit | Attending: Surgery | Admitting: Surgery

## 2021-10-07 ENCOUNTER — Encounter (HOSPITAL_BASED_OUTPATIENT_CLINIC_OR_DEPARTMENT_OTHER): Payer: Self-pay

## 2021-10-07 DIAGNOSIS — D72828 Other elevated white blood cell count: Secondary | ICD-10-CM | POA: Insufficient documentation

## 2021-10-07 DIAGNOSIS — R1084 Generalized abdominal pain: Secondary | ICD-10-CM | POA: Diagnosis present

## 2021-10-07 MED ORDER — IOHEXOL 300 MG/ML  SOLN
100.0000 mL | Freq: Once | INTRAMUSCULAR | Status: AC | PRN
  Administered 2021-10-07: 85 mL via INTRAVENOUS

## 2021-10-10 ENCOUNTER — Ambulatory Visit: Payer: BC Managed Care – PPO | Admitting: Family Medicine

## 2021-10-10 ENCOUNTER — Encounter: Payer: Self-pay | Admitting: Family Medicine

## 2021-10-10 ENCOUNTER — Telehealth (INDEPENDENT_AMBULATORY_CARE_PROVIDER_SITE_OTHER): Payer: BC Managed Care – PPO | Admitting: Surgery

## 2021-10-10 VITALS — BP 154/85 | HR 77 | Temp 97.3°F | Ht 66.25 in | Wt 271.8 lb

## 2021-10-10 DIAGNOSIS — Z1211 Encounter for screening for malignant neoplasm of colon: Secondary | ICD-10-CM | POA: Diagnosis not present

## 2021-10-10 DIAGNOSIS — D72829 Elevated white blood cell count, unspecified: Secondary | ICD-10-CM

## 2021-10-10 DIAGNOSIS — E119 Type 2 diabetes mellitus without complications: Secondary | ICD-10-CM

## 2021-10-10 DIAGNOSIS — R030 Elevated blood-pressure reading, without diagnosis of hypertension: Secondary | ICD-10-CM | POA: Diagnosis not present

## 2021-10-10 DIAGNOSIS — D649 Anemia, unspecified: Secondary | ICD-10-CM

## 2021-10-10 NOTE — Patient Instructions (Addendum)
Please keep an eye on your blood pressures at home.  Get yourself a blood pressure cuff from the pharmacy and check it daily.  Goal less than 130/80. ? ?Continue metformin. ? ?Labs today. ? ?I have placed a referral to GI for colonoscopy. ? ?Consider tetanus vaccine, pneumonia vaccine.  Be sure to get your eyes checked once a year due to diabetes. ? ?Follow up in 3 months. ?

## 2021-10-10 NOTE — Telephone Encounter (Signed)
Called to update the patient on the results of his CT abdomen and pelvis.  I explained to the patient that there is no evidence of intra-abdominal abscess, and there are only postoperative changes.  I advised the patient to inform his primary care doctor of his sustained leukocytosis and the results of his CT abdomen and pelvis at his next visit.  No further interventions required from surgical standpoint.  I did inform the patient that he has a small right inguinal hernia noted on his CT abdomen and pelvis, but this does not require surgical intervention at this time unless he is symptomatic from it.  All questions were answered to his expressed satisfaction. ? ?Graciella Freer, DO ?[facility] ?[place], [place] [address] ?[phone] (office) ? ?

## 2021-10-12 DIAGNOSIS — Z0000 Encounter for general adult medical examination without abnormal findings: Secondary | ICD-10-CM | POA: Insufficient documentation

## 2021-10-12 DIAGNOSIS — R030 Elevated blood-pressure reading, without diagnosis of hypertension: Secondary | ICD-10-CM | POA: Insufficient documentation

## 2021-10-12 LAB — CMP14+EGFR
ALT: 21 IU/L (ref 0–44)
AST: 24 IU/L (ref 0–40)
Albumin/Globulin Ratio: 1.2 (ref 1.2–2.2)
Albumin: 4.1 g/dL (ref 3.8–4.8)
Alkaline Phosphatase: 96 IU/L (ref 44–121)
BUN/Creatinine Ratio: 20 (ref 10–24)
BUN: 13 mg/dL (ref 8–27)
Bilirubin Total: 0.5 mg/dL (ref 0.0–1.2)
CO2: 26 mmol/L (ref 20–29)
Calcium: 10.7 mg/dL — ABNORMAL HIGH (ref 8.6–10.2)
Chloride: 100 mmol/L (ref 96–106)
Creatinine, Ser: 0.65 mg/dL — ABNORMAL LOW (ref 0.76–1.27)
Globulin, Total: 3.3 g/dL (ref 1.5–4.5)
Glucose: 129 mg/dL — ABNORMAL HIGH (ref 70–99)
Potassium: 3.7 mmol/L (ref 3.5–5.2)
Sodium: 141 mmol/L (ref 134–144)
Total Protein: 7.4 g/dL (ref 6.0–8.5)
eGFR: 107 mL/min/{1.73_m2} (ref >59)

## 2021-10-12 LAB — MICROALBUMIN / CREATININE URINE RATIO
Creatinine, Urine: 101.1 mg/dL
Microalb/Creat Ratio: 4 mg/g creat (ref 0–29)
Microalbumin, Urine: 4.5 ug/mL

## 2021-10-12 LAB — LIPID PANEL
Chol/HDL Ratio: 2.7 ratio (ref 0.0–5.0)
Cholesterol, Total: 192 mg/dL (ref 100–199)
HDL: 72 mg/dL (ref >39)
LDL Chol Calc (NIH): 100 mg/dL — ABNORMAL HIGH (ref 0–99)
Triglycerides: 112 mg/dL (ref 0–149)
VLDL Cholesterol Cal: 20 mg/dL (ref 5–40)

## 2021-10-12 LAB — CBC
Hematocrit: 40.4 % (ref 37.5–51.0)
Hemoglobin: 13.3 g/dL (ref 13.0–17.7)
MCH: 28 pg (ref 26.6–33.0)
MCHC: 32.9 g/dL (ref 31.5–35.7)
MCV: 85 fL (ref 79–97)
Platelets: 269 10*3/uL (ref 150–450)
RBC: 4.75 x10E6/uL (ref 4.14–5.80)
RDW: 12.6 % (ref 11.6–15.4)
WBC: 7.9 10*3/uL (ref 3.4–10.8)

## 2021-10-12 NOTE — Assessment & Plan Note (Signed)
Continue metformin. Needs to work on lifestyle changes/weight loss. ?

## 2021-10-12 NOTE — Assessment & Plan Note (Signed)
BP elevated today. Advised to keep close eye on BP readings at home. If elevated at follow up will start pharmacotherapy. ?

## 2021-10-12 NOTE — Progress Notes (Signed)
? ?Subjective:  ?Patient ID: Angel Berry, male    DOB: [birth year]  Age: 61 y.o. MRN: [id number] ? ?CC: ?Chief Complaint  ?Patient presents with  ? Establish Care  ?  Pt here to establish care. Checking sugars BID-running 120-130s mostly. Healthy Diet ?March 23-pt had appendix removed  ? ? ?HPI: ? ?61 year old male presents to establish care. ? ?Patient recently had acute appendicitis. During admission was found to have Type 2 diabetes (A1c 7.6).  He is recovering well from surgery. Is currently on Metformin 500 mg BID. ? ?BP elevated today. Has no history of hypertension. BP readings were elevated during hospitalization. ? ?BMI 43.5. Needs weight loss/lifestyle changes. ? ?Needs labs. Will discuss preventative measures today.  ? ?Patient Active Problem List  ? Diagnosis Date Noted  ? Elevated BP without diagnosis of hypertension 10/12/2021  ? Preventative health care 10/12/2021  ? Normocytic anemia 09/14/2021  ? DM (diabetes mellitus) ([place]) 09/12/2021  ? Obesity, Class III, BMI 40-49.9 (morbid obesity) ([place]) 09/12/2021  ? ? ?Social Hx   ?Social History  ? ?Socioeconomic History  ? Marital status: Married  ?  Spouse name: Not on file  ? Number of children: Not on file  ? Years of education: Not on file  ? Highest education level: Not on file  ?Occupational History  ? Not on file  ?Tobacco Use  ? Smoking status: Former  ?  Types: Cigarettes  ? Smokeless tobacco: Never  ?Substance and Sexual Activity  ? Alcohol use: Yes  ? Drug use: Not Currently  ? Sexual activity: Not on file  ?Other Topics Concern  ? Not on file  ?Social History Narrative  ? Not on file  ? ?Social Determinants of Health  ? ?Financial Resource Strain: Not on file  ?Food Insecurity: Not on file  ?Transportation Needs: Not on file  ?Physical Activity: Not on file  ?Stress: Not on file  ?Social Connections: Not on file  ? ? ?Review of Systems  ?Constitutional: Negative.   ?Gastrointestinal: Negative.   ? ? ?Objective:  ?BP (!) 154/85   Pulse 77    Temp (!) 97.3 ?F (36.3 ?C)   Ht 5' 6.25" (1.683 m)   Wt 271 lb 12.8 oz (123.3 kg)   SpO2 95%   BMI 43.54 kg/m?  ? ? ?  10/10/2021  ?  9:14 AM 09/23/2021  ?  9:13 AM 09/15/2021  ? 12:44 PM  ?BP/Weight  ?Systolic BP 124 580 998  ?Diastolic BP 85 79 87  ?Wt. (Lbs) 271.8 277   ?BMI 43.54 kg/m2 43.38 kg/m2   ? ? ?Physical Exam ?Vitals and nursing note reviewed.  ?Constitutional:   ?   General: He is not in acute distress. ?   Appearance: Normal appearance. He is obese.  ?HENT:  ?   Head: Normocephalic and atraumatic.  ?Eyes:  ?   General:     ?   Right eye: No discharge.     ?   Left eye: No discharge.  ?   Conjunctiva/sclera: Conjunctivae normal.  ?Cardiovascular:  ?   Rate and Rhythm: Normal rate and regular rhythm.  ?Pulmonary:  ?   Effort: Pulmonary effort is normal.  ?   Breath sounds: Normal breath sounds. No wheezing or rales.  ?Abdominal:  ?   General: There is no distension.  ?   Palpations: Abdomen is soft.  ?   Tenderness: There is no abdominal tenderness.  ?Neurological:  ?   Mental Status: He is alert.  ?  Psychiatric:     ?   Mood and Affect: Mood normal.     ?   Behavior: Behavior normal.  ? ? ?Lab Results  ?Component Value Date  ? WBC 7.9 10/10/2021  ? HGB 13.3 10/10/2021  ? HCT 40.4 10/10/2021  ? PLT 269 10/10/2021  ? GLUCOSE 129 (H) 10/10/2021  ? CHOL 192 10/10/2021  ? TRIG 112 10/10/2021  ? HDL 72 10/10/2021  ? LDLCALC 100 (H) 10/10/2021  ? ALT 21 10/10/2021  ? AST 24 10/10/2021  ? NA 141 10/10/2021  ? K 3.7 10/10/2021  ? CL 100 10/10/2021  ? CREATININE 0.65 (L) 10/10/2021  ? BUN 13 10/10/2021  ? CO2 26 10/10/2021  ? HGBA1C 7.6 (H) 09/11/2021  ? ? ? ?Assessment & Plan:  ? ?Problem List Items Addressed This Visit   ? ?  ? Endocrine  ? DM (diabetes mellitus) ([place]) - Primary (Chronic)  ?  Continue metformin. Needs to work on lifestyle changes/weight loss. ? ?  ?  ? Relevant Orders  ? CMP14+EGFR (Completed)  ? Microalbumin / creatinine urine ratio (Completed)  ?  ? Other  ? Normocytic anemia  ? Relevant  Orders  ? CBC (Completed)  ? Lipid panel (Completed)  ? Elevated BP without diagnosis of hypertension  ?  BP elevated today. Advised to keep close eye on BP readings at home. If elevated at follow up will start pharmacotherapy. ? ?  ?  ? ?Other Visit Diagnoses   ? ? Encounter for screening colonoscopy      ? Relevant Orders  ? Ambulatory referral to Gastroenterology  ? ?  ? ?Follow-up:  Return in about 3 months (around 01/09/2022). ? ?Thersa Salt DO ?[place] ? ?

## 2021-10-14 ENCOUNTER — Encounter: Payer: Self-pay | Admitting: Internal Medicine

## 2021-11-20 ENCOUNTER — Other Ambulatory Visit: Payer: Self-pay | Admitting: Family Medicine

## 2021-11-20 MED ORDER — METFORMIN HCL 500 MG PO TABS: 500.0000 mg | ORAL_TABLET | Freq: Two times a day (BID) | ORAL | 2 refills | Status: DC

## 2021-11-20 NOTE — Telephone Encounter (Signed)
Patient is requesting refill on metformin  500 to be called into [facility]

## 2021-11-25 ENCOUNTER — Encounter: Payer: Self-pay | Admitting: *Deleted

## 2021-12-02 ENCOUNTER — Encounter: Payer: Self-pay | Admitting: *Deleted

## 2021-12-02 NOTE — Patient Instructions (Signed)
Referring MD/PCP: Thersa Salt  Procedure: Colonoscopy  Has patient had this procedure before?  no  If so, when, by whom and where?    Is there a family history of colon cancer?  no  Who?  What age when diagnosed?    Is patient diabetic?yes, Type 2         Does patient have prosthetic heart valve or mechanical valve?  no  Do you have a pacemaker/defibrillator?  no  Has patient ever had endocarditis/atrial fibrillation? no  Does patient use oxygen? no  Has patient had joint replacement within last 12 months?  no  Is patient constipated or do they take laxatives? no  Does patient have a history of alcohol/drug use?  no  Have you had a stroke/heart attack last 6 mths? no  Do you take medicine for weight loss?  no   Is patient on blood thinner such as Coumadin, Plavix and/or Aspirin? no  Medications:  Current Outpatient Medications on File Prior to Visit  Medication Sig Dispense Refill   blood glucose meter kit and supplies KIT Dispense based on patient and insurance preference. Use up to four times daily as directed. 1 each 0   metFORMIN (GLUCOPHAGE) 500 MG tablet Take 1 tablet (500 mg total) by mouth 2 (two) times daily with a meal. 60 tablet 2   No current facility-administered medications on file prior to visit.     Allergies: No Known Allergies

## 2021-12-09 NOTE — Progress Notes (Signed)
Ok to schedule. ASA 3.  Day of prep: metformin am dose only Am of colonoscopy: hold metformin

## 2021-12-10 ENCOUNTER — Encounter: Payer: Self-pay | Admitting: *Deleted

## 2021-12-10 MED ORDER — PEG 3350-KCL-NA BICARB-NACL 420 G PO SOLR: ORAL | 0 refills | Status: DC

## 2021-12-10 NOTE — Progress Notes (Signed)
Spoke with pt. Scheduled for 7/31 at 9:15am. Aware will mail instructions/pre-op appt. [facility]

## 2021-12-17 ENCOUNTER — Ambulatory Visit: Payer: BC Managed Care – PPO

## 2022-01-12 NOTE — Patient Instructions (Addendum)
   Your procedure is scheduled on: 01/19/2022  Report to [facility] Main Entrance at    7:15 AM.  Call this number if you have problems the morning of surgery: [phone]   Remember:              Follow Directions on the letter you received from Your Physician's office regarding the Bowel Prep              No Smoking the day of Procedure :   Take these medicines the morning of surgery with A SIP OF WATER: none   Do not wear jewelry, make-up or nail polish.    Do not bring valuables to the hospital.  Contacts, dentures or bridgework may not be worn into surgery.  .   Patients discharged the day of surgery will not be allowed to drive home.     Colonoscopy, Adult, Care After This sheet gives you information about how to care for yourself after your procedure. Your health care provider may also give you more specific instructions. If you have problems or questions, contact your health care provider. What can I expect after the procedure? After the procedure, it is common to have: A small amount of blood in your stool for 24 hours after the procedure. Some gas. Mild abdominal cramping or bloating.  Follow these instructions at home: General instructions  For the first 24 hours after the procedure: Do not drive or use machinery. Do not sign important documents. Do not drink alcohol. Do your regular daily activities at a slower pace than normal. Eat soft, easy-to-digest foods. Rest often. Take over-the-counter or prescription medicines only as told by your health care provider. It is up to you to get the results of your procedure. Ask your health care provider, or the department performing the procedure, when your results will be ready. Relieving cramping and bloating Try walking around when you have cramps or feel bloated. Apply heat to your abdomen as told by your health care provider. Use a heat source that your health care provider recommends, such as a moist heat pack or a  heating pad. Place a towel between your skin and the heat source. Leave the heat on for 20-30 minutes. Remove the heat if your skin turns bright red. This is especially important if you are unable to feel pain, heat, or cold. You may have a greater risk of getting burned. Eating and drinking Drink enough fluid to keep your urine clear or pale yellow. Resume your normal diet as instructed by your health care provider. Avoid heavy or fried foods that are hard to digest. Avoid drinking alcohol for as long as instructed by your health care provider. Contact a health care provider if: You have blood in your stool 2-3 days after the procedure. Get help right away if: You have more than a small spotting of blood in your stool. You pass large blood clots in your stool. Your abdomen is swollen. You have nausea or vomiting. You have a fever. You have increasing abdominal pain that is not relieved with medicine. This information is not intended to replace advice given to you by your health care provider. Make sure you discuss any questions you have with your health care provider. Document Released: 01/21/2004 Document Revised: 03/02/2016 Document Reviewed: 08/20/2015 Elsevier Interactive Patient Education  [employer].

## 2022-01-14 ENCOUNTER — Encounter (HOSPITAL_COMMUNITY): Payer: Self-pay

## 2022-01-14 ENCOUNTER — Encounter (HOSPITAL_COMMUNITY)
Admission: RE | Admit: 2022-01-14 | Discharge: 2022-01-14 | Disposition: A | Discharge status: Home / Self Care | Payer: BC Managed Care – PPO | Source: Ambulatory Visit | Attending: Internal Medicine | Admitting: Internal Medicine

## 2022-01-14 ENCOUNTER — Other Ambulatory Visit: Payer: Self-pay

## 2022-01-14 HISTORY — DX: Type 2 diabetes mellitus without complications: E11.9

## 2022-01-19 ENCOUNTER — Encounter (HOSPITAL_COMMUNITY)
Admission: RE | Disposition: A | Discharge status: Home / Self Care | Payer: Self-pay | Source: Home / Self Care | Attending: Internal Medicine

## 2022-01-19 ENCOUNTER — Ambulatory Visit (HOSPITAL_COMMUNITY): Payer: BC Managed Care – PPO | Admitting: Anesthesiology

## 2022-01-19 ENCOUNTER — Other Ambulatory Visit: Payer: Self-pay

## 2022-01-19 ENCOUNTER — Encounter (HOSPITAL_COMMUNITY): Payer: Self-pay

## 2022-01-19 ENCOUNTER — Ambulatory Visit (HOSPITAL_COMMUNITY)
Admission: RE | Admit: 2022-01-19 | Discharge: 2022-01-19 | Disposition: A | Discharge status: Home / Self Care | Payer: BC Managed Care – PPO | Attending: Internal Medicine | Admitting: Internal Medicine

## 2022-01-19 DIAGNOSIS — Z87891 Personal history of nicotine dependence: Secondary | ICD-10-CM | POA: Insufficient documentation

## 2022-01-19 DIAGNOSIS — K635 Polyp of colon: Secondary | ICD-10-CM | POA: Insufficient documentation

## 2022-01-19 DIAGNOSIS — Z1211 Encounter for screening for malignant neoplasm of colon: Secondary | ICD-10-CM | POA: Diagnosis present

## 2022-01-19 DIAGNOSIS — E119 Type 2 diabetes mellitus without complications: Secondary | ICD-10-CM | POA: Diagnosis not present

## 2022-01-19 DIAGNOSIS — Z6841 Body Mass Index (BMI) 40.0 and over, adult: Secondary | ICD-10-CM | POA: Insufficient documentation

## 2022-01-19 DIAGNOSIS — Z7984 Long term (current) use of oral hypoglycemic drugs: Secondary | ICD-10-CM | POA: Insufficient documentation

## 2022-01-19 HISTORY — PX: COLONOSCOPY WITH PROPOFOL: SHX5780

## 2022-01-19 HISTORY — PX: POLYPECTOMY: SHX5525

## 2022-01-19 LAB — GLUCOSE, CAPILLARY: Glucose-Capillary: 140 mg/dL — ABNORMAL HIGH (ref 70–99)

## 2022-01-19 SURGERY — COLONOSCOPY WITH PROPOFOL
Anesthesia: General

## 2022-01-19 MED ORDER — LACTATED RINGERS IV SOLN: INTRAVENOUS | Status: DC

## 2022-01-19 MED ORDER — PROPOFOL 500 MG/50ML IV EMUL
INTRAVENOUS | Status: DC | PRN
  Administered 2022-01-19: 200 ug/kg/min via INTRAVENOUS

## 2022-01-19 MED ORDER — LIDOCAINE 2% (20 MG/ML) 5 ML SYRINGE
INTRAMUSCULAR | Status: DC | PRN
  Administered 2022-01-19: 50 mg via INTRAVENOUS

## 2022-01-19 MED ORDER — PROPOFOL 10 MG/ML IV BOLUS
INTRAVENOUS | Status: DC | PRN
  Administered 2022-01-19: 120 mg via INTRAVENOUS

## 2022-01-19 NOTE — Op Note (Signed)
[facility] Patient Name: Angel Berry Procedure Date: 01/19/2022 8:45 AM MRN: [id number] Date of Birth: 06-06-1961 Attending MD: Elon Alas. Edgar Frisk CSN: [id number] Age: 61 Admit Type: Outpatient Procedure:                Colonoscopy Indications:              Screening for colorectal malignant neoplasm Providers:                Elon Alas. Abbey Chatters, DO, Lambert Mody, Hughie Closs RN, RN, Raphael Gibney, Technician, Aram Candela Referring MD:              Medicines:                See the Anesthesia note for documentation of the                            administered medications Complications:            No immediate complications. Estimated Blood Loss:     Estimated blood loss was minimal. Procedure:                Pre-Anesthesia Assessment:                           - The anesthesia plan was to use monitored                            anesthesia care (MAC).                           After obtaining informed consent, the colonoscope                            was passed under direct vision. Throughout the                            procedure, the patient's blood pressure, pulse, and                            oxygen saturations were monitored continuously. The                            PCF-HQ190L ([id number]) scope was introduced through                            the anus and advanced to the the cecum, identified                            by appendiceal orifice and ileocecal valve. The                            colonoscopy was performed without difficulty. The  patient tolerated the procedure well. The quality                            of the bowel preparation was evaluated using the                            BBPS [facility] Bowel Preparation Scale) with scores                            of: Right Colon = 3, Transverse Colon = 3 and Left                            Colon = 3 (entire mucosa  seen well with no residual                            staining, small fragments of stool or opaque                            liquid). The total BBPS score equals 9. Scope In: 9:25:08 AM Scope Out: 9:46:33 AM Scope Withdrawal Time: 0 hours 18 minutes 24 seconds  Total Procedure Duration: 0 hours 21 minutes 25 seconds  Findings:      The perianal and digital rectal examinations were normal.      A 12 mm polyp was found in the ileocecal valve. The polyp was       semi-pedunculated. The polyp was removed with a hot snare. Upon       re-inspection, polyp appeared to originate from terminal ileum. Stump       was then further resected with hot snare. Retrieval of both the polyp       and stump was complete.      A 15 mm polyp was found in the descending colon. The polyp was       pedunculated. The polyp was removed with a hot snare. Resection and       retrieval were complete.      The exam was otherwise without abnormality. Impression:               - One 12 mm polyp at the ileocecal valve, removed                            with a hot snare. Resected and retrieved.                           - One 15 mm polyp in the descending colon, removed                            with a hot snare. Resected and retrieved.                           - The examination was otherwise normal. Moderate Sedation:      Per Anesthesia Care Recommendation:           - Patient has a contact number available for  emergencies. The signs and symptoms of potential                            delayed complications were discussed with the                            patient. Return to normal activities tomorrow.                            Written discharge instructions were provided to the                            patient.                           - Resume previous diet.                           - Continue present medications.                           - Await pathology results.                            - Repeat colonoscopy date to be determined after                            pending pathology results are reviewed for                            surveillance based on pathology results.                           - Return to GI clinic PRN. Procedure Code(s):        --- Professional ---                           [phone], Colonoscopy, flexible; with removal of                            tumor(s), polyp(s), or other lesion(s) by snare                            technique Diagnosis Code(s):        --- Professional ---                           Z12.11, Encounter for screening for malignant                            neoplasm of colon                           K63.5, Polyp of colon CPT copyright 2019 American Medical Association. All rights reserved. The codes documented in this report are preliminary and upon coder review may  be revised to meet current compliance requirements. Elon Alas. Abbey Chatters,  DO Elon Alas. Abbey Chatters, DO 01/19/2022 9:50:37 AM This report has been signed electronically. Number of Addenda: 0

## 2022-01-19 NOTE — Anesthesia Preprocedure Evaluation (Signed)
Anesthesia Evaluation  Patient identified by MRN, date of birth, ID band Patient awake    Reviewed: Allergy & Precautions, H&P , NPO status , Patient's Chart, lab work & pertinent test results, reviewed documented beta blocker date and time   Airway Mallampati: II  TM Distance: >3 FB Neck ROM: full    Dental no notable dental hx.    Pulmonary neg pulmonary ROS, former smoker,    Pulmonary exam normal breath sounds clear to auscultation       Cardiovascular Exercise Tolerance: Good negative cardio ROS   Rhythm:regular Rate:Normal     Neuro/Psych negative neurological ROS  negative psych ROS   GI/Hepatic negative GI ROS, Neg liver ROS,   Endo/Other  diabetes, Type obesity  Renal/GU negative Renal ROS  negative genitourinary   Musculoskeletal   Abdominal   Peds  Hematology  (+) Blood dyscrasia, anemia ,   Anesthesia Other Findings   Reproductive/Obstetrics negative OB ROS                             Anesthesia Physical Anesthesia Plan  ASA: 3  Anesthesia Plan: General   Post-op Pain Management:    Induction:   PONV Risk Score and Plan: Propofol infusion  Airway Management Planned:   Additional Equipment:   Intra-op Plan:   Post-operative Plan:   Informed Consent: I have reviewed the patients History and Physical, chart, labs and discussed the procedure including the risks, benefits and alternatives for the proposed anesthesia with the patient or authorized representative who has indicated his/her understanding and acceptance.     Dental Advisory Given  Plan Discussed with: CRNA  Anesthesia Plan Comments:         Anesthesia Quick Evaluation

## 2022-01-19 NOTE — Transfer of Care (Signed)
Immediate Anesthesia Transfer of Care Note  Patient: Angel Berry  Procedure(s) Performed: COLONOSCOPY WITH PROPOFOL POLYPECTOMY  Patient Location: Short Stay  Anesthesia Type:MAC  Level of Consciousness: sedated, patient cooperative and responds to stimulation  Airway & Oxygen Therapy: Patient Spontanous Breathing  Post-op Assessment: Report given to RN, Post -op Vital signs reviewed and stable and Patient moving all extremities  Post vital signs: Reviewed and stable  Last Vitals:  Vitals Value Taken Time  BP    Temp    Pulse    Resp    SpO2      Last Pain:  Vitals:   01/19/22 0921  TempSrc:   PainSc: 0-No pain         Complications: No notable events documented.

## 2022-01-19 NOTE — H&P (Signed)
Primary Care Physician:  Coral Spikes, DO Primary Gastroenterologist:  Dr. Abbey Chatters  Pre-Procedure History & Physical: HPI:  Angel Berry is a 61 y.o. male is here for first ever colonoscopy for colon cancer screening purposes.  Patient denies any family history of colorectal cancer.  No melena or hematochezia.  No abdominal pain or unintentional weight loss.  No change in bowel habits.  Overall feels well from a GI standpoint.  Past Medical History:  Diagnosis Date   Diabetes mellitus without complication ([place])     Past Surgical History:  Procedure Laterality Date   CHOLECYSTECTOMY     LAPAROSCOPIC APPENDECTOMY N/A 09/11/2021   Procedure: APPENDECTOMY LAPAROSCOPIC;  Surgeon: Rusty Aus, DO;  Location: [facility];  Service: General;  Laterality: N/A;    Prior to Admission medications   Medication Sig Start Date End Date Taking? Authorizing Provider  metFORMIN (GLUCOPHAGE) 500 MG tablet Take 1 tablet (500 mg total) by mouth 2 (two) times daily with a meal. 11/20/21 02/18/22 Yes Cook, Jayce G, DO  polyethylene glycol-electrolytes (NULYTELY) 420 g solution As directed 12/10/21  Yes Anhthu Perdew K, DO  blood glucose meter kit and supplies KIT Dispense based on patient and insurance preference. Use up to four times daily as directed. 09/12/21   Raiford Noble Latif, DO    Allergies as of 12/10/2021   (No Known Allergies)    History reviewed. No pertinent family history.  Social History   Socioeconomic History   Marital status: Married    Spouse name: Not on file   Number of children: Not on file   Years of education: Not on file   Highest education level: Not on file  Occupational History   Not on file  Tobacco Use   Smoking status: Former    Types: Cigarettes   Smokeless tobacco: Never  Vaping Use   Vaping Use: Never used  Substance and Sexual Activity   Alcohol use: Yes   Drug use: Not Currently   Sexual activity: Yes  Other Topics Concern   Not on file   Social History Narrative   Not on file   Social Determinants of Health   Financial Resource Strain: Not on file  Food Insecurity: Not on file  Transportation Needs: Not on file  Physical Activity: Not on file  Stress: Not on file  Social Connections: Not on file  Intimate Partner Violence: Not on file    Review of Systems: See HPI, otherwise negative ROS  Physical Exam: Vital signs in last 24 hours: Temp:  [97.9 F (36.6 C)] 97.9 F (36.6 C) (07/31 0833) Pulse Rate:  [68] 68 (07/31 0833) Resp:  [18] 18 (07/31 0833) BP: (151)/(88) 151/88 (07/31 0833) SpO2:  [97 %] 97 % (07/31 0833)   General:   Alert,  Well-developed, well-nourished, pleasant and cooperative in NAD Head:  Normocephalic and atraumatic. Eyes:  Sclera clear, no icterus.   Conjunctiva pink. Ears:  Normal auditory acuity. Nose:  No deformity, discharge,  or lesions. Mouth:  No deformity or lesions, dentition normal. Neck:  Supple; no masses or thyromegaly. Lungs:  Clear throughout to auscultation.   No wheezes, crackles, or rhonchi. No acute distress. Heart:  Regular rate and rhythm; no murmurs, clicks, rubs,  or gallops. Abdomen:  Soft, nontender and nondistended. No masses, hepatosplenomegaly or hernias noted. Normal bowel sounds, without guarding, and without rebound.   Msk:  Symmetrical without gross deformities. Normal posture. Extremities:  Without clubbing or edema. Neurologic:  Alert and  oriented x4;  grossly normal neurologically. Skin:  Intact without significant lesions or rashes. Cervical Nodes:  No significant cervical adenopathy. Psych:  Alert and cooperative. Normal mood and affect.  Impression/Plan: Angel Berry is here for a colonoscopy to be performed for colon cancer screening purposes.  The risks of the procedure including infection, bleed, or perforation as well as benefits, limitations, alternatives and imponderables have been reviewed with the patient. Questions have been  answered. All parties agreeable.

## 2022-01-19 NOTE — Discharge Instructions (Addendum)
  Colonoscopy Discharge Instructions  Read the instructions outlined below and refer to this sheet in the next few weeks. These discharge instructions provide you with general information on caring for yourself after you leave the hospital. Your doctor may also give you specific instructions. While your treatment has been planned according to the most current medical practices available, unavoidable complications occasionally occur.   ACTIVITY You may resume your regular activity, but move at a slower pace for the next 24 hours.  Take frequent rest periods for the next 24 hours.  Walking will help get rid of the air and reduce the bloated feeling in your belly (abdomen).  No driving for 24 hours (because of the medicine (anesthesia) used during the test).   Do not sign any important legal documents or operate any machinery for 24 hours (because of the anesthesia used during the test).  NUTRITION Drink plenty of fluids.  You may resume your normal diet as instructed by your doctor.  Begin with a light meal and progress to your normal diet. Heavy or fried foods are harder to digest and may make you feel sick to your stomach (nauseated).  Avoid alcoholic beverages for 24 hours or as instructed.  MEDICATIONS You may resume your normal medications unless your doctor tells you otherwise.  WHAT YOU CAN EXPECT TODAY Some feelings of bloating in the abdomen.  Passage of more gas than usual.  Spotting of blood in your stool or on the toilet paper.  IF YOU HAD POLYPS REMOVED DURING THE COLONOSCOPY: No aspirin products for 7 days or as instructed.  No alcohol for 7 days or as instructed.  Eat a soft diet for the next 24 hours.  FINDING OUT THE RESULTS OF YOUR TEST Not all test results are available during your visit. If your test results are not back during the visit, make an appointment with your caregiver to find out the results. Do not assume everything is normal if you have not heard from your  caregiver or the medical facility. It is important for you to follow up on all of your test results.  SEEK IMMEDIATE MEDICAL ATTENTION IF: You have more than a spotting of blood in your stool.  Your belly is swollen (abdominal distention).  You are nauseated or vomiting.  You have a temperature over 101.  You have abdominal pain or discomfort that is severe or gets worse throughout the day.   Your colonoscopy revealed 2 large polyps.  I successfully removed both of these.  Await pathology results, my office will contact you.  Repeat colonoscopy timeline to be determined after path results reviewed.  Otherwise follow-up with GI as needed.  I hope you have a great rest of your week!  Hennie Duos. Marletta Lor, D.O. Gastroenterology and Hepatology [facility]

## 2022-01-20 LAB — SURGICAL PATHOLOGY

## 2022-01-20 NOTE — Anesthesia Postprocedure Evaluation (Signed)
Anesthesia Post Note  Patient: Angel Berry  Procedure(s) Performed: COLONOSCOPY WITH PROPOFOL POLYPECTOMY  Patient location during evaluation: Phase II Anesthesia Type: General Level of consciousness: awake Pain management: pain level controlled Vital Signs Assessment: post-procedure vital signs reviewed and stable Respiratory status: spontaneous breathing and respiratory function stable Cardiovascular status: blood pressure returned to baseline and stable Postop Assessment: no headache and no apparent nausea or vomiting Anesthetic complications: no Comments: Late entry   No notable events documented.   Last Vitals:  Vitals:   01/19/22 0949 01/19/22 0955  BP: (!) 98/53 105/74  Pulse: 85 88  Resp: 14 18  Temp: (!) 36.3 C   SpO2: 94% 96%    Last Pain:  Vitals:   01/19/22 0958  TempSrc:   PainSc: 0-No pain                 Windell Norfolk

## 2022-01-26 ENCOUNTER — Encounter (HOSPITAL_COMMUNITY): Payer: Self-pay | Admitting: Internal Medicine

## 2022-01-29 ENCOUNTER — Other Ambulatory Visit: Payer: Self-pay

## 2022-01-29 ENCOUNTER — Telehealth: Payer: Self-pay | Admitting: Internal Medicine

## 2022-01-29 ENCOUNTER — Inpatient Hospital Stay (HOSPITAL_COMMUNITY)
Admission: EM | Admit: 2022-01-29 | Discharge: 2022-01-31 | DRG: 920 | Disposition: A | Discharge status: Home / Self Care | Payer: BC Managed Care – PPO | Attending: Internal Medicine | Admitting: Internal Medicine

## 2022-01-29 ENCOUNTER — Telehealth: Payer: Self-pay

## 2022-01-29 ENCOUNTER — Encounter (HOSPITAL_COMMUNITY): Payer: Self-pay | Admitting: Emergency Medicine

## 2022-01-29 DIAGNOSIS — E119 Type 2 diabetes mellitus without complications: Secondary | ICD-10-CM | POA: Diagnosis not present

## 2022-01-29 DIAGNOSIS — D62 Acute posthemorrhagic anemia: Secondary | ICD-10-CM

## 2022-01-29 DIAGNOSIS — K91840 Postprocedural hemorrhage and hematoma of a digestive system organ or structure following a digestive system procedure: Principal | ICD-10-CM | POA: Diagnosis present

## 2022-01-29 DIAGNOSIS — K625 Hemorrhage of anus and rectum: Secondary | ICD-10-CM | POA: Diagnosis present

## 2022-01-29 DIAGNOSIS — K922 Gastrointestinal hemorrhage, unspecified: Secondary | ICD-10-CM | POA: Diagnosis present

## 2022-01-29 DIAGNOSIS — Z6841 Body Mass Index (BMI) 40.0 and over, adult: Secondary | ICD-10-CM

## 2022-01-29 DIAGNOSIS — K648 Other hemorrhoids: Secondary | ICD-10-CM | POA: Diagnosis present

## 2022-01-29 DIAGNOSIS — D72829 Elevated white blood cell count, unspecified: Secondary | ICD-10-CM | POA: Diagnosis present

## 2022-01-29 DIAGNOSIS — Z87891 Personal history of nicotine dependence: Secondary | ICD-10-CM

## 2022-01-29 DIAGNOSIS — I10 Essential (primary) hypertension: Secondary | ICD-10-CM | POA: Diagnosis present

## 2022-01-29 DIAGNOSIS — Y848 Other medical procedures as the cause of abnormal reaction of the patient, or of later complication, without mention of misadventure at the time of the procedure: Secondary | ICD-10-CM | POA: Diagnosis present

## 2022-01-29 DIAGNOSIS — Z7984 Long term (current) use of oral hypoglycemic drugs: Secondary | ICD-10-CM

## 2022-01-29 DIAGNOSIS — K921 Melena: Secondary | ICD-10-CM | POA: Diagnosis present

## 2022-01-29 DIAGNOSIS — K633 Ulcer of intestine: Secondary | ICD-10-CM | POA: Diagnosis present

## 2022-01-29 LAB — COMPREHENSIVE METABOLIC PANEL
ALT: 43 U/L (ref 0–44)
AST: 38 U/L (ref 15–41)
Albumin: 3.4 g/dL — ABNORMAL LOW (ref 3.5–5.0)
Alkaline Phosphatase: 82 U/L (ref 38–126)
Anion gap: 7 (ref 5–15)
BUN: 14 mg/dL (ref 8–23)
CO2: 26 mmol/L (ref 22–32)
Calcium: 9.9 mg/dL (ref 8.9–10.3)
Chloride: 101 mmol/L (ref 98–111)
Creatinine, Ser: 0.61 mg/dL (ref 0.61–1.24)
GFR, Estimated: >60 mL/min (ref >60)
Glucose, Bld: 136 mg/dL — ABNORMAL HIGH (ref 70–99)
Potassium: 3.5 mmol/L (ref 3.5–5.1)
Sodium: 134 mmol/L — ABNORMAL LOW (ref 135–145)
Total Bilirubin: 0.7 mg/dL (ref 0.3–1.2)
Total Protein: 7.3 g/dL (ref 6.5–8.1)

## 2022-01-29 LAB — CBC
HCT: 40.4 % (ref 39.0–52.0)
Hemoglobin: 13.4 g/dL (ref 13.0–17.0)
MCH: 29.1 pg (ref 26.0–34.0)
MCHC: 33.2 g/dL (ref 30.0–36.0)
MCV: 87.8 fL (ref 80.0–100.0)
Platelets: 230 10*3/uL (ref 150–400)
RBC: 4.6 MIL/uL (ref 4.22–5.81)
RDW: 13.4 % (ref 11.5–15.5)
WBC: 13.5 10*3/uL — ABNORMAL HIGH (ref 4.0–10.5)
nRBC: 0 % (ref 0.0–0.2)

## 2022-01-29 LAB — HEMOGLOBIN AND HEMATOCRIT, BLOOD
HCT: 36.1 % — ABNORMAL LOW (ref 39.0–52.0)
Hemoglobin: 12.3 g/dL — ABNORMAL LOW (ref 13.0–17.0)

## 2022-01-29 LAB — TYPE AND SCREEN
ABO/RH(D): O POS
Antibody Screen: NEGATIVE

## 2022-01-29 LAB — GLUCOSE, CAPILLARY: Glucose-Capillary: 116 mg/dL — ABNORMAL HIGH (ref 70–99)

## 2022-01-29 MED ORDER — PANTOPRAZOLE SODIUM 40 MG IV SOLR
40.0000 mg | INTRAVENOUS | Status: AC
  Administered 2022-01-29: 40 mg via INTRAVENOUS
  Filled 2022-01-29: qty 10

## 2022-01-29 MED ORDER — ORAL CARE MOUTH RINSE: 15.0000 mL | OROMUCOSAL | Status: DC | PRN

## 2022-01-29 MED ORDER — SODIUM CHLORIDE 0.9 % IV SOLN: INTRAVENOUS | Status: AC

## 2022-01-29 MED ORDER — ACETAMINOPHEN 325 MG PO TABS: 650.0000 mg | ORAL_TABLET | Freq: Four times a day (QID) | ORAL | Status: DC | PRN

## 2022-01-29 MED ORDER — ONDANSETRON HCL 4 MG PO TABS: 4.0000 mg | ORAL_TABLET | Freq: Four times a day (QID) | ORAL | Status: DC | PRN

## 2022-01-29 MED ORDER — POTASSIUM CHLORIDE CRYS ER 20 MEQ PO TBCR
40.0000 meq | EXTENDED_RELEASE_TABLET | Freq: Once | ORAL | Status: AC
  Administered 2022-01-29: 40 meq via ORAL
  Filled 2022-01-29: qty 2

## 2022-01-29 MED ORDER — INSULIN ASPART 100 UNIT/ML IJ SOLN: 0.0000 [IU] | Freq: Three times a day (TID) | INTRAMUSCULAR | Status: DC

## 2022-01-29 MED ORDER — ACETAMINOPHEN 650 MG RE SUPP: 650.0000 mg | Freq: Four times a day (QID) | RECTAL | Status: DC | PRN

## 2022-01-29 MED ORDER — PANTOPRAZOLE SODIUM 40 MG IV SOLR
40.0000 mg | INTRAVENOUS | Status: DC
  Administered 2022-01-30: 40 mg via INTRAVENOUS
  Filled 2022-01-29: qty 10

## 2022-01-29 MED ORDER — INSULIN ASPART 100 UNIT/ML IJ SOLN
0.0000 [IU] | Freq: Three times a day (TID) | INTRAMUSCULAR | Status: DC
  Administered 2022-01-30 (×3): 1 [IU] via SUBCUTANEOUS

## 2022-01-29 MED ORDER — ONDANSETRON HCL 4 MG/2ML IJ SOLN: 4.0000 mg | Freq: Four times a day (QID) | INTRAMUSCULAR | Status: DC | PRN

## 2022-01-29 MED ORDER — SODIUM CHLORIDE 0.9 % IV BOLUS
500.0000 mL | Freq: Once | INTRAVENOUS | Status: AC
  Administered 2022-01-29: 500 mL via INTRAVENOUS

## 2022-01-29 NOTE — Assessment & Plan Note (Addendum)
Has had several episodes of bloody bowel movements since onset, witnessed in ED.  Hemoglobin stable at 13.4.  Vitals stable.  Denies significant NSAID use.  No hematemesis, denies prior melena.  Abdomen benign on exam.  Last CT abdomen and pelvis with contrast 09/2021 without diverticulosis or other etiology to explain bleeding. -Trend hemoglobin every 8 hourly -Transfuse for hemoglobin less than 8 -N.p.o. midnight, Clear liquid diet -GI to see in a.m. -IV Protonix 40 daily -500 bolus, continue N/s 100cc/hr x 20hrs -Leukocytosis 13.5, trend for now

## 2022-01-29 NOTE — Assessment & Plan Note (Signed)
Random glucose 136.  A1c 7.6. - SSi- S q8h -Hold metformin

## 2022-01-29 NOTE — Telephone Encounter (Signed)
done

## 2022-01-29 NOTE — ED Notes (Signed)
This RN witnessed blood in the toilet after pt went to the BR to have a BM. The toilet bowl was mostly filled with blood, unable to visualize stool amount

## 2022-01-29 NOTE — ED Triage Notes (Signed)
Pt presents with melena that started last night, had colonoscopy done on 01/19/22.

## 2022-01-29 NOTE — H&P (Signed)
History and Physical    Angel Berry AVW:[id number] DOB: [birth year] DOA: 01/29/2022  PCP: Coral Spikes, DO   Patient coming from: Home  I have personally briefly reviewed patient's old medical records in [place]  Chief Complaint: Bleeding per rectum  HPI: Angel Berry is a 61 y.o. male with medical history significant for diabetes mellitus. Presented to the ED with complaints of at least 6 episodes of bloody stools that started last night.  Patient reports watery like diarrhea with the blood.  He denies prior history of GI bleed, no black stools.  No vomiting.  No abdominal pain.  He takes 2-3 over-the-counter tablets of ibuprofen 1ce to twice a week.  He is not on any anticoagulation or antiplatelet. Patient had a colonoscopy 7/21, 2 polyps were removed, and had done well until yesterday.  Reports a mild brief episode of dizziness earlier today that has since resolved.  No chest pain or difficulty breathing. He had a bloody bowel movement witnessed here in the ED, and the toilet bowl was mostly filled with blood, unable to visualize stool.  ED Course: Tmax 98.5.  Heart rate 79-88.  Respiratory rate 16-26.  Blood pressure systolic 829-562.  O2 sats greater than 96% on room air. Hemoglobin stable at 13.4. EDP consulted GI Dr. Gala Romney.  Review of Systems: As per HPI all other systems reviewed and negative.  Past Medical History:  Diagnosis Date   Diabetes mellitus without complication [facility])     Past Surgical History:  Procedure Laterality Date   CHOLECYSTECTOMY     COLONOSCOPY WITH PROPOFOL N/A 01/19/2022   Procedure: COLONOSCOPY WITH PROPOFOL;  Surgeon: Eloise Harman, DO;  Location: [facility];  Service: Endoscopy;  Laterality: N/A;  9:15am, asa 3   LAPAROSCOPIC APPENDECTOMY N/A 09/11/2021   Procedure: APPENDECTOMY LAPAROSCOPIC;  Surgeon: Rusty Aus, DO;  Location: [facility];  Service: General;  Laterality: N/A;   POLYPECTOMY  01/19/2022   Procedure:  POLYPECTOMY;  Surgeon: Eloise Harman, DO;  Location: [facility];  Service: Endoscopy;;     reports that he has quit smoking. His smoking use included cigarettes. He has never used smokeless tobacco. He reports current alcohol use. He reports that he does not currently use drugs.  No Known Allergies  History of hypertension.  Prior to Admission medications   Medication Sig Start Date End Date Taking? Authorizing Provider  blood glucose meter kit and supplies KIT Dispense based on patient and insurance preference. Use up to four times daily as directed. 09/12/21   Raiford Noble Latif, DO  metFORMIN (GLUCOPHAGE) 500 MG tablet Take 1 tablet (500 mg total) by mouth 2 (two) times daily with a meal. 11/20/21 02/18/22  Coral Spikes, DO    Physical Exam: Vitals:   01/29/22 1507 01/29/22 1508  BP: (!) 147/78   Pulse: 88   Resp: 16   Temp: 98.1 F (36.7 C)   TempSrc: Oral   SpO2: 96%   Weight:  115.2 kg  Height:  5' 7" (1.702 m)    Constitutional: NAD, calm, comfortable Vitals:   01/29/22 1507 01/29/22 1508  BP: (!) 147/78   Pulse: 88   Resp: 16   Temp: 98.1 F (36.7 C)   TempSrc: Oral   SpO2: 96%   Weight:  115.2 kg  Height:  5' 7" (1.702 m)   Eyes: PERRL, lids and conjunctivae normal ENMT: Mucous membranes are moist.  Neck: normal, supple, no masses, no thyromegaly Respiratory: clear to auscultation  bilaterally, no wheezing, no crackles. Normal respiratory effort. No accessory muscle use.  Cardiovascular: Regular rate and rhythm, no murmurs / rubs / gallops. No extremity edema.  Abdomen: no tenderness, no masses palpated. No hepatosplenomegaly. Bowel sounds positive.  Musculoskeletal: no clubbing / cyanosis. No joint deformity upper and lower extremities. Good ROM, no contractures. Normal muscle tone.  Skin: no rashes, lesions, ulcers. No induration Neurologic: No apparent cranial abnormality, and expected spontaneously Psychiatric: Normal judgment and insight. Alert and  oriented x 3. Normal mood.   Labs on Admission: I have personally reviewed following labs and imaging studies  CBC: Recent Labs  Lab 01/29/22 1552  WBC 13.5*  HGB 13.4  HCT 40.4  MCV 87.8  PLT 557   Basic Metabolic Panel: Recent Labs  Lab 01/29/22 1552  NA 134*  K 3.5  CL 101  CO2 26  GLUCOSE 136*  BUN 14  CREATININE 0.61  CALCIUM 9.9   GFR: Estimated Creatinine Clearance: 114.6 mL/min (by C-G formula based on SCr of 0.61 mg/dL). Liver Function Tests: Recent Labs  Lab 01/29/22 1552  AST 38  ALT 43  ALKPHOS 82  BILITOT 0.7  PROT 7.3  ALBUMIN 3.4*    Radiological Exams on Admission: No results found.  EKG: None  Assessment/Plan Principal Problem:   Acute GI bleeding Active Problems:   DM (diabetes mellitus) ([place])   Assessment and Plan: * Acute GI bleeding Has had several episodes of bloody bowel movements since onset, witnessed in ED.  Hemoglobin stable at 13.4.  Vitals stable.  Denies significant NSAID use.  No hematemesis, denies prior melena.  Abdomen benign on exam.  Last CT abdomen and pelvis with contrast 09/2021 without diverticulosis or other etiology to explain bleeding. -Trend hemoglobin every 8 hourly -Transfuse for hemoglobin less than 8 -N.p.o. midnight, Clear liquid diet -GI to see in a.m. -IV Protonix 40 daily -500 bolus, continue N/s 100cc/hr x 20hrs -Leukocytosis 13.5, trend for now  DM (diabetes mellitus) ([facility]) Random glucose 136.  A1c 7.6. - SSi- S q8h -Hold metformin    DVT prophylaxis:  SCDS Code Status: Full Family Communication: Spouse and grandson at bedside Disposition Plan: ~ 1-2 days Consults called: GI  Admission status:  Obs tele    Bethena Roys MD [facility]  01/29/2022, 8:59 PM    For on call review [email or web address].

## 2022-01-29 NOTE — Telephone Encounter (Signed)
Pt phoned and advises that he had blood in his stool last night and after a BM this morning. Pt wants to now if this is normal after a colonoscopy. Pt has colonoscopy 01/19/2022. This is the first time seeing blood. Please advise

## 2022-01-29 NOTE — ED Provider Notes (Signed)
[facility] EMERGENCY DEPARTMENT Provider Note   CSN: [id number] Arrival date & time: 01/29/22  1429     History  Chief Complaint  Patient presents with   Melena    Angel Berry is a 61 y.o. male.  HPI  This patient is a 61 year old male who states he does take metformin for diabetes but takes no other daily medicines.  He reports that he had a colonoscopy done on January 19, 2022 during which time he was told he had a couple of polyps that were removed, he is not on any anticoagulants or aspirin.  Last night he started to feel some discomfort in his rectal area and had a couple of mild diarrhea bowel movements that were mixed with bright red blood.  He has had 2 more of these bright red blood bowel movements today and states it was a significant amount of blood in the commode.  He denies feeling lightheaded, he is not short of breath, he is not dizzy or near syncopal and he has no chest pain or shortness of breath.  He has no abdominal discomfort today.  He left a message for gastroenterology but decided to come to the hospital for further evaluation  Home Medications Prior to Admission medications   Medication Sig Start Date End Date Taking? Authorizing Provider  blood glucose meter kit and supplies KIT Dispense based on patient and insurance preference. Use up to four times daily as directed. 09/12/21   Raiford Noble Latif, DO  metFORMIN (GLUCOPHAGE) 500 MG tablet Take 1 tablet (500 mg total) by mouth 2 (two) times daily with a meal. 11/20/21 02/18/22  Coral Spikes, DO      Allergies    Patient has no known allergies.    Review of Systems   Review of Systems  All other systems reviewed and are negative.   Physical Exam Updated Vital Signs BP (!) 147/78   Pulse 88   Temp 98.1 F (36.7 C) (Oral)   Resp 16   Ht 1.702 m (5' 7")   Wt 115.2 kg   SpO2 96%   BMI 39.78 kg/m  Physical Exam Vitals and nursing note reviewed.  Constitutional:      General: He is not in acute  distress.    Appearance: He is well-developed.  HENT:     Head: Normocephalic and atraumatic.     Mouth/Throat:     Pharynx: No oropharyngeal exudate.  Eyes:     General: No scleral icterus.       Right eye: No discharge.        Left eye: No discharge.     Conjunctiva/sclera: Conjunctivae normal.     Pupils: Pupils are equal, round, and reactive to light.  Neck:     Thyroid: No thyromegaly.     Vascular: No JVD.  Cardiovascular:     Rate and Rhythm: Normal rate and regular rhythm.     Heart sounds: Normal heart sounds. No murmur heard.    No friction rub. No gallop.  Pulmonary:     Effort: Pulmonary effort is normal. No respiratory distress.     Breath sounds: Normal breath sounds. No wheezing or rales.  Abdominal:     General: Bowel sounds are normal. There is no distension.     Palpations: Abdomen is soft. There is no mass.     Tenderness: There is no abdominal tenderness.  Musculoskeletal:        General: No tenderness. Normal range of motion.  Cervical back: Normal range of motion and neck supple.     Right lower leg: No edema.     Left lower leg: No edema.  Lymphadenopathy:     Cervical: No cervical adenopathy.  Skin:    General: Skin is warm and dry.     Findings: No erythema or rash.  Neurological:     Mental Status: He is alert.     Coordination: Coordination normal.  Psychiatric:        Behavior: Behavior normal.     ED Results / Procedures / Treatments   Labs (all labs ordered are listed, but only abnormal results are displayed) Labs Reviewed  COMPREHENSIVE METABOLIC PANEL - Abnormal; Notable for the following components:      Result Value   Sodium 134 (*)    Glucose, Bld 136 (*)    Albumin 3.4 (*)    All other components within normal limits  CBC - Abnormal; Notable for the following components:   WBC 13.5 (*)    All other components within normal limits  POC OCCULT BLOOD, ED  TYPE AND SCREEN    EKG None  Radiology No results  found.  Procedures Procedures    Medications Ordered in ED Medications  pantoprazole (PROTONIX) injection 40 mg (has no administration in time range)    ED Course/ Medical Decision Making/ A&P                           Medical Decision Making Amount and/or Complexity of Data Reviewed Labs: ordered.  Risk Decision regarding hospitalization.   Thankfully the patient's exam is unremarkable however given the reported history of 4 bowel movements today with bright to dark red blood I am concerned about a lower colonic bleed.  This patient presents to the ED for concern of rectal bleeding, this involves an extensive number of treatment options, and is a complaint that carries with it a high risk of complications and morbidity.  The differential diagnosis includes diverticular bleed, post snare removal bleeding, hemorrhoidal bleeding, fissure.   Co morbidities that complicate the patient evaluation  Diabetes, recent colonoscopy   Additional history obtained:  Additional history obtained from electronic medical record External records from outside source obtained and reviewed including colonoscopy report showing that there was 2 polyps 1 at the ileocecal valve and 1 in the descending colon, specifically there was no comments on the presence of any diverticula and the imaging in the chart showed no diverticula just the presence of the 2 polyps.   Lab Tests:  I Ordered, and personally interpreted labs.  The pertinent results include: CBC shows a leukocytosis but no significant anemia   Cardiac Monitoring: / EKG:  The patient was maintained on a cardiac monitor.  I personally viewed and interpreted the cardiac monitored which showed an underlying rhythm of: Normal sinus rhythm, no tachycardia   Consultations Obtained:  I requested consultation with the gastroenterology Dr. Gala Romney recommends admission for the observation if bleeding continues and or is heavy,  and discussed lab  and imaging findings as well as pertinent plan - they recommend: Hospitalist will admit, appreciate Dr. Denton Brick   Problem List / ED Course / Critical interventions / Medication management  GI bleed, ongoing in the ER but stable vital signs and labs, needs admission for observation and repeat hemoglobin in the morning, potential intervention if continues to bleed heavily.  Clear liquid diet per GI I ordered medication including Protonix for GI bleed  Reevaluation of the patient after these medicines showed that the patient stable I have reviewed the patients home medicines and have made adjustments as needed   Social Determinants of Health:  None   Test / Admission - Considered:  Will admit        Final Clinical Impression(s) / ED Diagnoses Final diagnoses:  Gastrointestinal hemorrhage, unspecified gastrointestinal hemorrhage type    Rx / DC Orders ED Discharge Orders     None         Noemi Chapel, MD 01/29/22 1739

## 2022-01-29 NOTE — Telephone Encounter (Signed)
Angel Berry in the ED called me about this patient.  Multiple polypectomies 7/31.      Patient reported symptoms to our office this morning but did not get a call back.  Presented to ED.  He was found to be entirely stable with normal labs.  Occasional NSAIDs no significant comorbidities.  Not anticoagulated.   I advised Dr. Hyacinth Meeker  could go 1 of 2 ways.   Either Observe overnight , clear liquid diet,  trend H&H or patient can go home on a clear liquid diet , observe for recurrent bleeding overnight - repeat H&H tomorrow if clinically indicated   Come back should bleeding worsen.

## 2022-01-30 DIAGNOSIS — K91840 Postprocedural hemorrhage and hematoma of a digestive system organ or structure following a digestive system procedure: Secondary | ICD-10-CM | POA: Diagnosis present

## 2022-01-30 DIAGNOSIS — K648 Other hemorrhoids: Secondary | ICD-10-CM | POA: Diagnosis present

## 2022-01-30 DIAGNOSIS — Z87891 Personal history of nicotine dependence: Secondary | ICD-10-CM | POA: Diagnosis not present

## 2022-01-30 DIAGNOSIS — D62 Acute posthemorrhagic anemia: Secondary | ICD-10-CM

## 2022-01-30 DIAGNOSIS — Z6841 Body Mass Index (BMI) 40.0 and over, adult: Secondary | ICD-10-CM | POA: Diagnosis not present

## 2022-01-30 DIAGNOSIS — K625 Hemorrhage of anus and rectum: Secondary | ICD-10-CM | POA: Diagnosis present

## 2022-01-30 DIAGNOSIS — I10 Essential (primary) hypertension: Secondary | ICD-10-CM | POA: Diagnosis present

## 2022-01-30 DIAGNOSIS — K921 Melena: Secondary | ICD-10-CM | POA: Diagnosis present

## 2022-01-30 DIAGNOSIS — K922 Gastrointestinal hemorrhage, unspecified: Secondary | ICD-10-CM | POA: Diagnosis present

## 2022-01-30 DIAGNOSIS — Y848 Other medical procedures as the cause of abnormal reaction of the patient, or of later complication, without mention of misadventure at the time of the procedure: Secondary | ICD-10-CM | POA: Diagnosis present

## 2022-01-30 DIAGNOSIS — Z7984 Long term (current) use of oral hypoglycemic drugs: Secondary | ICD-10-CM | POA: Diagnosis not present

## 2022-01-30 DIAGNOSIS — K633 Ulcer of intestine: Secondary | ICD-10-CM | POA: Diagnosis present

## 2022-01-30 DIAGNOSIS — D72829 Elevated white blood cell count, unspecified: Secondary | ICD-10-CM | POA: Diagnosis present

## 2022-01-30 DIAGNOSIS — E119 Type 2 diabetes mellitus without complications: Secondary | ICD-10-CM | POA: Diagnosis present

## 2022-01-30 LAB — CBC
HCT: 32.6 % — ABNORMAL LOW (ref 39.0–52.0)
HCT: 34 % — ABNORMAL LOW (ref 39.0–52.0)
Hemoglobin: 10.9 g/dL — ABNORMAL LOW (ref 13.0–17.0)
Hemoglobin: 11.4 g/dL — ABNORMAL LOW (ref 13.0–17.0)
MCH: 29.6 pg (ref 26.0–34.0)
MCH: 29.5 pg (ref 26.0–34.0)
MCHC: 33.4 g/dL (ref 30.0–36.0)
MCHC: 33.5 g/dL (ref 30.0–36.0)
MCV: 88.6 fL (ref 80.0–100.0)
MCV: 88.1 fL (ref 80.0–100.0)
Platelets: 180 10*3/uL (ref 150–400)
Platelets: 234 10*3/uL (ref 150–400)
RBC: 3.68 MIL/uL — ABNORMAL LOW (ref 4.22–5.81)
RBC: 3.86 MIL/uL — ABNORMAL LOW (ref 4.22–5.81)
RDW: 13.4 % (ref 11.5–15.5)
RDW: 13.5 % (ref 11.5–15.5)
WBC: 9.6 10*3/uL (ref 4.0–10.5)
WBC: 12.5 10*3/uL — ABNORMAL HIGH (ref 4.0–10.5)
nRBC: 0 % (ref 0.0–0.2)
nRBC: 0 % (ref 0.0–0.2)

## 2022-01-30 LAB — HEMOGLOBIN A1C
Hgb A1c MFr Bld: 6.3 % — ABNORMAL HIGH (ref 4.8–5.6)
Mean Plasma Glucose: 134.11 mg/dL

## 2022-01-30 LAB — GLUCOSE, CAPILLARY
Glucose-Capillary: 126 mg/dL — ABNORMAL HIGH (ref 70–99)
Glucose-Capillary: 127 mg/dL — ABNORMAL HIGH (ref 70–99)
Glucose-Capillary: 136 mg/dL — ABNORMAL HIGH (ref 70–99)

## 2022-01-30 MED ORDER — PEG 3350-KCL-NA BICARB-NACL 420 G PO SOLR
4000.0000 mL | Freq: Once | ORAL | Status: AC
Start: 2022-01-30 — End: 2022-01-30
  Administered 2022-01-30: 4000 mL via ORAL

## 2022-01-30 NOTE — Telephone Encounter (Signed)
Noted  

## 2022-01-30 NOTE — Progress Notes (Signed)
  Transition of Care [facility]) Screening Note   Patient Details  Name: Angel Berry Date of Birth: 1960-12-19   Transition of Care [facility]) CM/SW Contact:    Villa Herb, LCSWA Phone Number: 01/30/2022, 10:41 AM    Transition of Care Department [facility]) has reviewed patient and no TOC needs have been identified at this time. We will continue to monitor patient advancement through interdisciplinary progression rounds. If new patient transition needs arise, please place a TOC consult.

## 2022-01-30 NOTE — Progress Notes (Signed)
PROGRESS NOTE    [name]  EZM:[id number] DOB: 08-May-1961 DOA: 01/29/2022 PCP: Tommie Sams, DO   Brief Narrative:    Angel Berry is a 61 y.o. male with medical history significant for diabetes mellitus. Presented to the ED with complaints of at least 6 episodes of bloody stools that started last night. Patient had a colonoscopy 7/21, 2 polyps were removed, and had done well until yesterday.  He was admitted with acute GI bleeding and plans are for colonoscopy in a.m. with possible need for transfusion if hemoglobin continues to trend downward.  Assessment & Plan:   Principal Problem:   Acute GI bleeding Active Problems:   DM (diabetes mellitus) (HCC)   Rectal bleed  Assessment and Plan:   Acute GI bleeding Has had several episodes of bloody bowel movements since onset, witnessed in ED.  Hemoglobin stable at 13.4.  Vitals stable.  Denies significant NSAID use.  No hematemesis, denies prior melena.  Abdomen benign on exam.  Last CT abdomen and pelvis with contrast 09/2021 without diverticulosis or other etiology to explain bleeding. -Trend hemoglobin every 8 hourly -N.p.o. midnight, Clear liquid diet -GI planning for colonoscopy in a.m. with prep initiated -IV Protonix 40 daily -500 bolus, continue N/s 100cc/hr x 20hrs  Acute blood loss anemia secondary to above -Continue to monitor H&H and transfuse for hemoglobin less than 7   DM (diabetes mellitus) (HCC) Random glucose 136.  A1c 6.3 - SSi- S q8h -Hold metformin   Morbid obesity -BMI 42.4    DVT prophylaxis: SCDs Code Status: Full Family Communication: None at bedside Disposition Plan:  Status is: Inpatient Remains inpatient appropriate because: Need for IV medications  Consultants:  GI  Procedures:  None  Antimicrobials:  None   Subjective: Patient seen and evaluated today with some ongoing bright red stools with clots last night along with 1 episode this morning.  He denies abdominal pain,  nausea, or vomiting.  Objective: Vitals:   01/29/22 2153 01/30/22 0224 01/30/22 0450 01/30/22 1100  BP: (!) 146/95 (!) 147/70 129/71 (!) 144/90  Pulse: 77 74 68 92  Resp: 16 16 18 18   Temp: 97.9 F (36.6 C) 97.8 F (36.6 C) 98.4 F (36.9 C) 98 F (36.7 C)  TempSrc: Oral Oral Oral Oral  SpO2: 100% 98% 98% 99%  Weight: 122.8 kg     Height: 5\' 7"  (1.702 m)       Intake/Output Summary (Last 24 hours) at 01/30/2022 1257 Last data filed at 01/30/2022 0900 Gross per 24 hour  Intake 1499.12 ml  Output --  Net 1499.12 ml   Filed Weights   01/29/22 1508 01/29/22 2153  Weight: 115.2 kg 122.8 kg    Examination:  General exam: Appears calm and comfortable  Respiratory system: Clear to auscultation. Respiratory effort normal. Cardiovascular system: S1 & S2 heard, RRR.  Gastrointestinal system: Abdomen is soft Central nervous system: Alert and awake Extremities: No edema Skin: No significant lesions noted Psychiatry: Flat affect.    Data Reviewed: I have personally reviewed following labs and imaging studies  CBC: Recent Labs  Lab 01/29/22 1552 01/29/22 2127 01/30/22 0829  WBC 13.5*  --  9.6  HGB 13.4 12.3* 10.9*  HCT 40.4 36.1* 32.6*  MCV 87.8  --  88.6  PLT 230  --  180   Basic Metabolic Panel: Recent Labs  Lab 01/29/22 1552  NA 134*  K 3.5  CL 101  CO2 26  GLUCOSE 136*  BUN 14  CREATININE 0.61  CALCIUM 9.9   GFR: Estimated Creatinine Clearance: 118.7 mL/min (by C-G formula based on SCr of 0.61 mg/dL). Liver Function Tests: Recent Labs  Lab 01/29/22 1552  AST 38  ALT 43  ALKPHOS 82  BILITOT 0.7  PROT 7.3  ALBUMIN 3.4*   No results for input(s): "LIPASE", "AMYLASE" in the last 168 hours. No results for input(s): "AMMONIA" in the last 168 hours. Coagulation Profile: No results for input(s): "INR", "PROTIME" in the last 168 hours. Cardiac Enzymes: No results for input(s): "CKTOTAL", "CKMB", "CKMBINDEX", "TROPONINI" in the last 168 hours. BNP  (last 3 results) No results for input(s): "PROBNP" in the last 8760 hours. HbA1C: Recent Labs    01/29/22 1552  HGBA1C 6.3*   CBG: Recent Labs  Lab 01/29/22 2206 01/30/22 0717  GLUCAP 116* 136*   Lipid Profile: No results for input(s): "CHOL", "HDL", "LDLCALC", "TRIG", "CHOLHDL", "LDLDIRECT" in the last 72 hours. Thyroid Function Tests: No results for input(s): "TSH", "T4TOTAL", "FREET4", "T3FREE", "THYROIDAB" in the last 72 hours. Anemia Panel: No results for input(s): "VITAMINB12", "FOLATE", "FERRITIN", "TIBC", "IRON", "RETICCTPCT" in the last 72 hours. Sepsis Labs: No results for input(s): "PROCALCITON", "LATICACIDVEN" in the last 168 hours.  No results found for this or any previous visit (from the past 240 hour(s)).       Radiology Studies: No results found.      Scheduled Meds:  insulin aspart  0-9 Units Subcutaneous Q8H   pantoprazole (PROTONIX) IV  40 mg Intravenous Q24H   polyethylene glycol-electrolytes  4,000 mL Oral Once   Continuous Infusions:  sodium chloride 100 mL/hr at 01/30/22 0500     LOS: 0 days    Time spent: 35 minutes    Artez Regis Hoover Brunette, DO [facility]  If 7PM-7AM, please contact night-coverage [email or web address] 01/30/2022, 12:57 PM

## 2022-01-30 NOTE — Telephone Encounter (Signed)
noted 

## 2022-01-30 NOTE — Consult Note (Signed)
Gastroenterology Consult   Referring Provider: No ref. provider found Primary Care Physician:  Coral Spikes, DO Primary Gastroenterologist:  Dr. Abbey Chatters  Patient ID: Angel Berry; [id number]; January 16, 1961   Admit date: 01/29/2022  LOS: 0 days   Date of Consultation: 01/30/2022  Reason for Consultation: hematochezia/lower GI bleed post polypectomy  History of Present Illness   Angel Berry is a 61 y.o. year old male with history of diabetes.  Patient presented to the ED with bright red blood per rectum that started on 01/28/2022.  Also reported a brief episode of dizziness earlier yesterday.  Given concern for lower GI bleed post colonoscopy, GI was consulted with initial recommendation for overnight observation.  ED course: Vital signs stable. Hemoglobin 13.4, sodium 134, glucose 136, normal renal function and liver function.   Colonoscopy 01/19/2022: 12 mm polyp in the ileocecal valve removed with hot snare, 15 mm polyp in descending colon removed with hot snare.  No mention of diverticulosis.    Patient began seeing bright red blood mixed with a little diarrhea on 8/9.  Yesterday morning he again went to the bathroom to have a bowel movement and had mostly bright red blood with some clots and very little solid stool.  After coming to the ED he is only had some broth and no solid food.  He reports he has been feeling fine at home other than noticing the blood in his stool.  He denies any abdominal pain, nausea, vomiting, fevers, or dark/black stool.  He denies any overt constipation or diarrhea.  He denies any syncope, lightheadedness, or dizziness.  He states he typically only takes ibuprofen about once a week for occasional back pain.  He denies any chest pain or shortness of breath.  He has had about 3 bright red stools with some clots last night and 1 episode this morning prior to my exam.    Past Medical History:  Diagnosis Date   Diabetes mellitus without complication  [facility])     Past Surgical History:  Procedure Laterality Date   CHOLECYSTECTOMY     COLONOSCOPY WITH PROPOFOL N/A 01/19/2022   Procedure: COLONOSCOPY WITH PROPOFOL;  Surgeon: Eloise Harman, DO;  Location: [facility] ENDO SUITE;  Service: Endoscopy;  Laterality: N/A;  9:15am, asa 3   LAPAROSCOPIC APPENDECTOMY N/A 09/11/2021   Procedure: APPENDECTOMY LAPAROSCOPIC;  Surgeon: Rusty Aus, DO;  Location: [facility] ORS;  Service: General;  Laterality: N/A;   POLYPECTOMY  01/19/2022   Procedure: POLYPECTOMY;  Surgeon: Eloise Harman, DO;  Location: [facility] ENDO SUITE;  Service: Endoscopy;;    Prior to Admission medications   Medication Sig Start Date End Date Taking? Authorizing Provider  ibuprofen (ADVIL) 200 MG tablet Take 600 mg by mouth every 6 (six) hours as needed.   Yes [provider]  metFORMIN (GLUCOPHAGE) 500 MG tablet Take 1 tablet (500 mg total) by mouth 2 (two) times daily with a meal. 11/20/21 02/18/22 Yes Cook, Jayce G, DO  blood glucose meter kit and supplies KIT Dispense based on patient and insurance preference. Use up to four times daily as directed. 09/12/21   Kerney Elbe, DO    Current Facility-Administered Medications  Medication Dose Route Frequency Provider Last Rate Last Admin   0.9 %  sodium chloride infusion   Intravenous Continuous Emokpae, Ejiroghene E, MD 100 mL/hr at 01/30/22 0500 Infusion Verify at 01/30/22 0500   acetaminophen (TYLENOL) tablet 650 mg  650 mg Oral Q6H PRN Bethena Roys, MD  Or   acetaminophen (TYLENOL) suppository 650 mg  650 mg Rectal Q6H PRN Emokpae, Ejiroghene E, MD       insulin aspart (novoLOG) injection 0-9 Units  0-9 Units Subcutaneous Q8H Emokpae, Ejiroghene E, MD   1 Units at 01/30/22 0928   ondansetron (ZOFRAN) tablet 4 mg  4 mg Oral Q6H PRN Emokpae, Ejiroghene E, MD       Or   ondansetron (ZOFRAN) injection 4 mg  4 mg Intravenous Q6H PRN Emokpae, Ejiroghene E, MD       Oral care mouth rinse  15 mL Mouth Rinse PRN  Emokpae, Ejiroghene E, MD       pantoprazole (PROTONIX) injection 40 mg  40 mg Intravenous Q24H Emokpae, Ejiroghene E, MD        Allergies as of 01/29/2022   (No Known Allergies)    History reviewed. No pertinent family history.  Social History   Socioeconomic History   Marital status: Married    Spouse name: Not on file   Number of children: Not on file   Years of education: Not on file   Highest education level: Not on file  Occupational History   Not on file  Tobacco Use   Smoking status: Former    Types: Cigarettes   Smokeless tobacco: Never  Vaping Use   Vaping Use: Never used  Substance and Sexual Activity   Alcohol use: Yes   Drug use: Not Currently   Sexual activity: Yes  Other Topics Concern   Not on file  Social History Narrative   Not on file   Social Determinants of Health   Financial Resource Strain: Not on file  Food Insecurity: Not on file  Transportation Needs: Not on file  Physical Activity: Not on file  Stress: Not on file  Social Connections: Not on file  Intimate Partner Violence: Not on file     Review of Systems   Gen: Denies any fever, chills, loss of appetite, change in weight or weight loss CV: Denies chest pain, heart palpitations, syncope, edema  Resp: Denies shortness of breath with rest, cough, wheezing, coughing up blood, and pleurisy. GI: see HPI GU : Denies urinary burning, blood in urine, urinary frequency, and urinary incontinence. MS: Denies joint pain, limitation of movement, swelling, cramps, and atrophy.  Derm: Denies rash, itching, dry skin, hives. Psych: Denies depression, anxiety, memory loss, hallucinations, and confusion. Heme: Denies bruising or bleeding Neuro:  Denies any headaches, dizziness, paresthesias, shaking  Physical Exam   Vital Signs in last 24 hours: Temp:  [97.8 F (36.6 C)-98.5 F (36.9 C)] 98.4 F (36.9 C) (08/11 0450) Pulse Rate:  [68-88] 68 (08/11 0450) Resp:  [16-26] 18 (08/11 0450) BP:  (105-147)/(70-98) 129/71 (08/11 0450) SpO2:  [96 %-100 %] 98 % (08/11 0450) FiO2 (%):  [21 %] 21 % (08/10 2146) Weight:  [115.2 kg-122.8 kg] 122.8 kg (08/10 2153) Last BM Date : 01/29/22  General:   Alert,  Well-developed, well-nourished, pleasant and cooperative in NAD Head:  Normocephalic and atraumatic. Eyes:  Sclera clear, no icterus.   Conjunctiva pink. Ears:  Normal auditory acuity. Lungs:  Clear throughout to auscultation.   No wheezes, crackles, or rhonchi. No acute distress. Heart:  Regular rate and rhythm; no murmurs, clicks, rubs,  or gallops. Abdomen:  Soft, nontender and nondistended. No masses, hepatosplenomegaly or hernias noted. Normal bowel sounds, without guarding, and without rebound.   Rectal: deferred   Msk:  Symmetrical without gross deformities. Normal posture. Extremities:  Without  clubbing or edema. Neurologic:  Alert and  oriented x4. Skin:  Intact without significant lesions or rashes. Psych:  Alert and cooperative. Normal mood and affect.  Intake/Output from previous day: 08/10 0701 - 08/11 0700 In: 1499.1 [P.O.:480; I.V.:519.1; IV Piggyback:500] Out: -  Intake/Output this shift: No intake/output data recorded.   Labs/Studies   Recent Labs Recent Labs    01/29/22 1552 01/29/22 2127 01/30/22 0829  WBC 13.5*  --  9.6  HGB 13.4 12.3* 10.9*  HCT 40.4 36.1* 32.6*  PLT 230  --  180   BMET Recent Labs    01/29/22 1552  NA 134*  K 3.5  CL 101  CO2 26  GLUCOSE 136*  BUN 14  CREATININE 0.61  CALCIUM 9.9   LFT Recent Labs    01/29/22 1552  PROT 7.3  ALBUMIN 3.4*  AST 38  ALT 43  ALKPHOS 82  BILITOT 0.7   PT/INR No results for input(s): "LABPROT", "INR" in the last 72 hours. Hepatitis Panel No results for input(s): "HEPBSAG", "HCVAB", "HEPAIGM", "HEPBIGM" in the last 72 hours. C-Diff No results for input(s): "CDIFFTOX" in the last 72 hours.  Radiology/Studies No results found.   Assessment   Angel Berry is a 61 y.o.  year old male with history of diabetes who presented to the ED with rectal bleeding.  Given patient has post colonoscopy GI was consulted for further evaluation.  Lower GI bleed post polypectomy: Patient began having rectal discomfort at home with a couple episodes of mild diarrhea that were mixed with bright red blood.  It was described as a significant amount in the commode.  He initially had a feeling of lightheadedness but denied any shortness of breath or syncopal episodes.  He denied any abdominal discomfort.  He also denies any frequent NSAID use and is not on any anticoagulation.  Hemoglobin 13.4 on admission, has drifted down to 10.9 this morning.  He has had multiple bright red bowel movements with clots since admission.  He was started on PPI and initially given clear liquids yesterday and made NPO overnight.  Given almost 3 point drop in hemoglobin and ongoing rectal bleeding we will pursue repeat colonoscopy tomorrow to assess for source of bleeding given recent polypectomy.  He can have clear liquids today.   Plan / Recommendations   Continue PPI Clear liquid diet GoLytely this afternoon Continue to monitor CBC, transfuse for hemoglobin less than 7 Colonoscopy tomorrow with Dr. Abbey Chatters.     01/30/2022, 9:32 AM  Venetia Night, MSN, FNP-BC, AGACNP-BC [facility]

## 2022-01-30 NOTE — Anesthesia Preprocedure Evaluation (Signed)
Anesthesia Evaluation  Patient identified by MRN, date of birth, ID band Patient awake    Reviewed: Allergy & Precautions, NPO status , Patient's Chart, lab work & pertinent test results  Airway Mallampati: III  TM Distance: >3 FB Neck ROM: Full    Dental  (+) Dental Advisory Given, Missing, Loose,  Loose bridge:   Pulmonary former smoker,    Pulmonary exam normal breath sounds clear to auscultation       Cardiovascular negative cardio ROS Normal cardiovascular exam Rhythm:Regular Rate:Normal     Neuro/Psych negative neurological ROS  negative psych ROS   GI/Hepatic Neg liver ROS, GERD (mild)  ,Acute lower GI bleed   Endo/Other  diabetes, Well Controlled, Type 2, Oral Hypoglycemic AgentsMorbid obesity  Renal/GU negative Renal ROS  negative genitourinary   Musculoskeletal negative musculoskeletal ROS (+)   Abdominal   Peds negative pediatric ROS (+)  Hematology  (+) Blood dyscrasia, anemia ,   Anesthesia Other Findings   Reproductive/Obstetrics negative OB ROS                            Anesthesia Physical  Anesthesia Plan  ASA: 3 and emergent  Anesthesia Plan: General   Post-op Pain Management: Minimal or no pain anticipated   Induction: Intravenous  PONV Risk Score and Plan: Propofol infusion  Airway Management Planned: Nasal Cannula and Natural Airway  Additional Equipment:   Intra-op Plan:   Post-operative Plan: Possible Post-op intubation/ventilation  Informed Consent: I have reviewed the patients History and Physical, chart, labs and discussed the procedure including the risks, benefits and alternatives for the proposed anesthesia with the patient or authorized representative who has indicated his/her understanding and acceptance.     Dental advisory given  Plan Discussed with: Surgeon  Anesthesia Plan Comments:       Anesthesia Quick Evaluation

## 2022-01-30 NOTE — H&P (View-Only) (Signed)
Gastroenterology Consult   Referring Provider: No ref. provider found Primary Care Physician:  Coral Spikes, DO Primary Gastroenterologist:  Dr. Abbey Chatters  Patient ID: Angel Berry; [id number]; January 16, 1961   Admit date: 01/29/2022  LOS: 0 days   Date of Consultation: 01/30/2022  Reason for Consultation: hematochezia/lower GI bleed post polypectomy  History of Present Illness   Angel Berry is a 61 y.o. year old male with history of diabetes.  Patient presented to the ED with bright red blood per rectum that started on 01/28/2022.  Also reported a brief episode of dizziness earlier yesterday.  Given concern for lower GI bleed post colonoscopy, GI was consulted with initial recommendation for overnight observation.  ED course: Vital signs stable. Hemoglobin 13.4, sodium 134, glucose 136, normal renal function and liver function.   Colonoscopy 01/19/2022: 12 mm polyp in the ileocecal valve removed with hot snare, 15 mm polyp in descending colon removed with hot snare.  No mention of diverticulosis.    Patient began seeing bright red blood mixed with a little diarrhea on 8/9.  Yesterday morning he again went to the bathroom to have a bowel movement and had mostly bright red blood with some clots and very little solid stool.  After coming to the ED he is only had some broth and no solid food.  He reports he has been feeling fine at home other than noticing the blood in his stool.  He denies any abdominal pain, nausea, vomiting, fevers, or dark/black stool.  He denies any overt constipation or diarrhea.  He denies any syncope, lightheadedness, or dizziness.  He states he typically only takes ibuprofen about once a week for occasional back pain.  He denies any chest pain or shortness of breath.  He has had about 3 bright red stools with some clots last night and 1 episode this morning prior to my exam.    Past Medical History:  Diagnosis Date   Diabetes mellitus without complication  [facility])     Past Surgical History:  Procedure Laterality Date   CHOLECYSTECTOMY     COLONOSCOPY WITH PROPOFOL N/A 01/19/2022   Procedure: COLONOSCOPY WITH PROPOFOL;  Surgeon: Eloise Harman, DO;  Location: [facility];  Service: Endoscopy;  Laterality: N/A;  9:15am, asa 3   LAPAROSCOPIC APPENDECTOMY N/A 09/11/2021   Procedure: APPENDECTOMY LAPAROSCOPIC;  Surgeon: Rusty Aus, DO;  Location: [facility] ORS;  Service: General;  Laterality: N/A;   POLYPECTOMY  01/19/2022   Procedure: POLYPECTOMY;  Surgeon: Eloise Harman, DO;  Location: [facility];  Service: Endoscopy;;    Prior to Admission medications   Medication Sig Start Date End Date Taking? Authorizing Provider  ibuprofen (ADVIL) 200 MG tablet Take 600 mg by mouth every 6 (six) hours as needed.   Yes [provider]  metFORMIN (GLUCOPHAGE) 500 MG tablet Take 1 tablet (500 mg total) by mouth 2 (two) times daily with a meal. 11/20/21 02/18/22 Yes Cook, Jayce G, DO  blood glucose meter kit and supplies KIT Dispense based on patient and insurance preference. Use up to four times daily as directed. 09/12/21   Kerney Elbe, DO    Current Facility-Administered Medications  Medication Dose Route Frequency Provider Last Rate Last Admin   0.9 %  sodium chloride infusion   Intravenous Continuous Emokpae, Ejiroghene E, MD 100 mL/hr at 01/30/22 0500 Infusion Verify at 01/30/22 0500   acetaminophen (TYLENOL) tablet 650 mg  650 mg Oral Q6H PRN Bethena Roys, MD  Or   acetaminophen (TYLENOL) suppository 650 mg  650 mg Rectal Q6H PRN Emokpae, Ejiroghene E, MD       insulin aspart (novoLOG) injection 0-9 Units  0-9 Units Subcutaneous Q8H Emokpae, Ejiroghene E, MD   1 Units at 01/30/22 0928   ondansetron (ZOFRAN) tablet 4 mg  4 mg Oral Q6H PRN Emokpae, Ejiroghene E, MD       Or   ondansetron (ZOFRAN) injection 4 mg  4 mg Intravenous Q6H PRN Emokpae, Ejiroghene E, MD       Oral care mouth rinse  15 mL Mouth Rinse PRN  Emokpae, Ejiroghene E, MD       pantoprazole (PROTONIX) injection 40 mg  40 mg Intravenous Q24H Emokpae, Ejiroghene E, MD        Allergies as of 01/29/2022   (No Known Allergies)    History reviewed. No pertinent family history.  Social History   Socioeconomic History   Marital status: Married    Spouse name: Not on file   Number of children: Not on file   Years of education: Not on file   Highest education level: Not on file  Occupational History   Not on file  Tobacco Use   Smoking status: Former    Types: Cigarettes   Smokeless tobacco: Never  Vaping Use   Vaping Use: Never used  Substance and Sexual Activity   Alcohol use: Yes   Drug use: Not Currently   Sexual activity: Yes  Other Topics Concern   Not on file  Social History Narrative   Not on file   Social Determinants of Health   Financial Resource Strain: Not on file  Food Insecurity: Not on file  Transportation Needs: Not on file  Physical Activity: Not on file  Stress: Not on file  Social Connections: Not on file  Intimate Partner Violence: Not on file     Review of Systems   Gen: Denies any fever, chills, loss of appetite, change in weight or weight loss CV: Denies chest pain, heart palpitations, syncope, edema  Resp: Denies shortness of breath with rest, cough, wheezing, coughing up blood, and pleurisy. GI: see HPI GU : Denies urinary burning, blood in urine, urinary frequency, and urinary incontinence. MS: Denies joint pain, limitation of movement, swelling, cramps, and atrophy.  Derm: Denies rash, itching, dry skin, hives. Psych: Denies depression, anxiety, memory loss, hallucinations, and confusion. Heme: Denies bruising or bleeding Neuro:  Denies any headaches, dizziness, paresthesias, shaking  Physical Exam   Vital Signs in last 24 hours: Temp:  [97.8 F (36.6 C)-98.5 F (36.9 C)] 98.4 F (36.9 C) (08/11 0450) Pulse Rate:  [68-88] 68 (08/11 0450) Resp:  [16-26] 18 (08/11 0450) BP:  (105-147)/(70-98) 129/71 (08/11 0450) SpO2:  [96 %-100 %] 98 % (08/11 0450) FiO2 (%):  [21 %] 21 % (08/10 2146) Weight:  [115.2 kg-122.8 kg] 122.8 kg (08/10 2153) Last BM Date : 01/29/22  General:   Alert,  Well-developed, well-nourished, pleasant and cooperative in NAD Head:  Normocephalic and atraumatic. Eyes:  Sclera clear, no icterus.   Conjunctiva pink. Ears:  Normal auditory acuity. Lungs:  Clear throughout to auscultation.   No wheezes, crackles, or rhonchi. No acute distress. Heart:  Regular rate and rhythm; no murmurs, clicks, rubs,  or gallops. Abdomen:  Soft, nontender and nondistended. No masses, hepatosplenomegaly or hernias noted. Normal bowel sounds, without guarding, and without rebound.   Rectal: deferred   Msk:  Symmetrical without gross deformities. Normal posture. Extremities:  Without  clubbing or edema. Neurologic:  Alert and  oriented x4. Skin:  Intact without significant lesions or rashes. Psych:  Alert and cooperative. Normal mood and affect.  Intake/Output from previous day: 08/10 0701 - 08/11 0700 In: 1499.1 [P.O.:480; I.V.:519.1; IV Piggyback:500] Out: -  Intake/Output this shift: No intake/output data recorded.   Labs/Studies   Recent Labs Recent Labs    01/29/22 1552 01/29/22 2127 01/30/22 0829  WBC 13.5*  --  9.6  HGB 13.4 12.3* 10.9*  HCT 40.4 36.1* 32.6*  PLT 230  --  180   BMET Recent Labs    01/29/22 1552  NA 134*  K 3.5  CL 101  CO2 26  GLUCOSE 136*  BUN 14  CREATININE 0.61  CALCIUM 9.9   LFT Recent Labs    01/29/22 1552  PROT 7.3  ALBUMIN 3.4*  AST 38  ALT 43  ALKPHOS 82  BILITOT 0.7   PT/INR No results for input(s): "LABPROT", "INR" in the last 72 hours. Hepatitis Panel No results for input(s): "HEPBSAG", "HCVAB", "HEPAIGM", "HEPBIGM" in the last 72 hours. C-Diff No results for input(s): "CDIFFTOX" in the last 72 hours.  Radiology/Studies No results found.   Assessment   Angel Berry is a 61 y.o.  year old male with history of diabetes who presented to the ED with rectal bleeding.  Given patient has post colonoscopy GI was consulted for further evaluation.  Lower GI bleed post polypectomy: Patient began having rectal discomfort at home with a couple episodes of mild diarrhea that were mixed with bright red blood.  It was described as a significant amount in the commode.  He initially had a feeling of lightheadedness but denied any shortness of breath or syncopal episodes.  He denied any abdominal discomfort.  He also denies any frequent NSAID use and is not on any anticoagulation.  Hemoglobin 13.4 on admission, has drifted down to 10.9 this morning.  He has had multiple bright red bowel movements with clots since admission.  He was started on PPI and initially given clear liquids yesterday and made NPO overnight.  Given almost 3 point drop in hemoglobin and ongoing rectal bleeding we will pursue repeat colonoscopy tomorrow to assess for source of bleeding given recent polypectomy.  He can have clear liquids today.   Plan / Recommendations   Continue PPI Clear liquid diet GoLytely this afternoon Continue to monitor CBC, transfuse for hemoglobin less than 7 Colonoscopy tomorrow with Dr. Abbey Chatters.     01/30/2022, 9:32 AM  Venetia Night, MSN, FNP-BC, AGACNP-BC [facility]

## 2022-01-31 ENCOUNTER — Telehealth: Payer: Self-pay | Admitting: Internal Medicine

## 2022-01-31 ENCOUNTER — Inpatient Hospital Stay (HOSPITAL_COMMUNITY): Payer: BC Managed Care – PPO | Admitting: Anesthesiology

## 2022-01-31 ENCOUNTER — Encounter (HOSPITAL_COMMUNITY)
Admission: EM | Disposition: A | Discharge status: Home / Self Care | Payer: Self-pay | Source: Home / Self Care | Attending: Internal Medicine

## 2022-01-31 DIAGNOSIS — K633 Ulcer of intestine: Secondary | ICD-10-CM | POA: Diagnosis not present

## 2022-01-31 DIAGNOSIS — K922 Gastrointestinal hemorrhage, unspecified: Secondary | ICD-10-CM | POA: Diagnosis not present

## 2022-01-31 DIAGNOSIS — D62 Acute posthemorrhagic anemia: Secondary | ICD-10-CM

## 2022-01-31 HISTORY — PX: HEMOSTASIS CLIP PLACEMENT: SHX6857

## 2022-01-31 HISTORY — PX: COLONOSCOPY WITH PROPOFOL: SHX5780

## 2022-01-31 LAB — BASIC METABOLIC PANEL
Anion gap: 3 — ABNORMAL LOW (ref 5–15)
BUN: 8 mg/dL (ref 8–23)
CO2: 27 mmol/L (ref 22–32)
Calcium: 9.2 mg/dL (ref 8.9–10.3)
Chloride: 108 mmol/L (ref 98–111)
Creatinine, Ser: 0.55 mg/dL — ABNORMAL LOW (ref 0.61–1.24)
GFR, Estimated: >60 mL/min (ref >60)
Glucose, Bld: 156 mg/dL — ABNORMAL HIGH (ref 70–99)
Potassium: 4.1 mmol/L (ref 3.5–5.1)
Sodium: 138 mmol/L (ref 135–145)

## 2022-01-31 LAB — GLUCOSE, CAPILLARY: Glucose-Capillary: 139 mg/dL — ABNORMAL HIGH (ref 70–99)

## 2022-01-31 LAB — MAGNESIUM: Magnesium: 1.7 mg/dL (ref 1.7–2.4)

## 2022-01-31 SURGERY — COLONOSCOPY WITH PROPOFOL
Anesthesia: General

## 2022-01-31 MED ORDER — LACTATED RINGERS IV SOLN: INTRAVENOUS | Status: DC | PRN

## 2022-01-31 MED ORDER — LIDOCAINE HCL (PF) 2 % IJ SOLN
INTRAMUSCULAR | Status: AC
  Filled 2022-01-31: qty 5

## 2022-01-31 MED ORDER — PROPOFOL 10 MG/ML IV BOLUS
INTRAVENOUS | Status: AC
  Filled 2022-01-31: qty 60

## 2022-01-31 MED ORDER — FENTANYL CITRATE (PF) 100 MCG/2ML IJ SOLN
INTRAMUSCULAR | Status: AC
  Filled 2022-01-31: qty 2

## 2022-01-31 MED ORDER — FENTANYL CITRATE (PF) 100 MCG/2ML IJ SOLN
INTRAMUSCULAR | Status: DC | PRN
  Administered 2022-01-31: 50 ug via INTRAVENOUS

## 2022-01-31 MED ORDER — PROPOFOL 10 MG/ML IV BOLUS
INTRAVENOUS | Status: DC | PRN
  Administered 2022-01-31: 100 mg via INTRAVENOUS
  Administered 2022-01-31: 10 mg via INTRAVENOUS
  Administered 2022-01-31 (×2): 20 mg via INTRAVENOUS

## 2022-01-31 MED ORDER — PHENYLEPHRINE 80 MCG/ML (10ML) SYRINGE FOR IV PUSH (FOR BLOOD PRESSURE SUPPORT)
PREFILLED_SYRINGE | INTRAVENOUS | Status: DC | PRN
  Administered 2022-01-31 (×3): 80 ug via INTRAVENOUS

## 2022-01-31 MED ORDER — PROPOFOL 500 MG/50ML IV EMUL
INTRAVENOUS | Status: DC | PRN
  Administered 2022-01-31: 150 ug/kg/min via INTRAVENOUS

## 2022-01-31 MED ORDER — LIDOCAINE HCL (CARDIAC) PF 100 MG/5ML IV SOSY
PREFILLED_SYRINGE | INTRAVENOUS | Status: DC | PRN
  Administered 2022-01-31: 60 mg via INTRATRACHEAL

## 2022-01-31 MED ORDER — PROPOFOL 500 MG/50ML IV EMUL: INTRAVENOUS | Status: DC | PRN

## 2022-01-31 MED ORDER — PHENYLEPHRINE 80 MCG/ML (10ML) SYRINGE FOR IV PUSH (FOR BLOOD PRESSURE SUPPORT)
PREFILLED_SYRINGE | INTRAVENOUS | Status: AC
  Filled 2022-01-31: qty 10

## 2022-01-31 MED ORDER — SODIUM CHLORIDE 0.9 % IV SOLN: INTRAVENOUS | Status: DC

## 2022-01-31 NOTE — Discharge Summary (Signed)
Physician Discharge Summary  Angel Berry QXI:[id number] DOB: 1960-08-15 DOA: 01/29/2022  PCP: Coral Spikes, DO  Admit date: 01/29/2022  Discharge date: 01/31/2022  Admitted From:Home  Disposition:  Home  Recommendations for Outpatient Follow-up:  Follow up with PCP in 1-2 weeks and repeat CBC in 3 to 5 days to ensure stability Follow-up with GI, Dr. Abbey Chatters in the next 3-4 months Continue home medications as prior  Home Health: None  Equipment/Devices: None  Discharge Condition:Stable  CODE STATUS: Full  Diet recommendation: Heart Healthy carb modified  Brief/Interim Summary: Angel Berry is a 61 y.o. male with medical history significant for diabetes mellitus. Presented to the ED with complaints of at least 6 episodes of bloody stools that started last night. Patient had a colonoscopy 7/21, 2 polyps were removed, and had done well until the day prior to admission.  He continued to have bleeding and worsening anemia during the course of the stay, but did not require transfusion.  He did undergo colonoscopy with 3 clips placed on 8/12 and was noted to be stable for discharge from GI standpoint.  He denies any complaints or concerns otherwise and has not had any further bloody bowel movements nor has he has any dizziness or lightheadedness or shortness of breath.  He appears to be in stable condition for discharge today and will need close follow-up with PCP to ensure hemoglobin levels remained stable.  He will have follow-up with GI in the next 3-4 months.  Discharge Diagnoses:  Principal Problem:   Acute GI bleeding Active Problems:   DM (diabetes mellitus) ([place])   Rectal bleed   ABLA (acute blood loss anemia)  Principal discharge diagnosis: Acute lower GI bleeding in the setting of recent polypectomy-underwent hemostasis with clip placement 8/12.  Acute blood loss anemia related to lower GI bleed with no need for transfusion.  Discharge Instructions  Discharge  Instructions     Ambulatory referral to Gastroenterology   Complete by: As directed    What is the reason for referral?: Other   Diet - low sodium heart healthy   Complete by: As directed    Increase activity slowly   Complete by: As directed       Allergies as of 01/31/2022   No Known Allergies      Medication List     STOP taking these medications    ibuprofen 200 MG tablet Commonly known as: ADVIL       TAKE these medications    blood glucose meter kit and supplies Kit Dispense based on patient and insurance preference. Use up to four times daily as directed.   metFORMIN 500 MG tablet Commonly known as: Glucophage Take 1 tablet (500 mg total) by mouth 2 (two) times daily with a meal.        Follow-up Information     [facility]. Go to.   Contact information: [place] [address] [phone]        Coral Spikes, DO. Schedule an appointment as soon as possible for a visit in 1 week(s).   Specialty: Family Medicine Contact information: [place] [address] [phone]                No Known Allergies  Consultations: GI   Procedures/Studies: No results found.   Discharge Exam: Vitals:   01/31/22 0921 01/31/22 0926  BP: (!) 116/59 (!) 113/98  Pulse:  77  Resp: 14 18  Temp:  SpO2: 100% 98%   Vitals:   01/31/22 0350 01/31/22 0914 01/31/22 0921 01/31/22 0926  BP: (!) 132/92 (!) 104/56 (!) 116/59 (!) 113/98  Pulse: 90   77  Resp: 18 14 14 18   Temp: 97.9 F (36.6 C) 97.8 F (36.6 C)    TempSrc:      SpO2: 98% 99% 100% 98%  Weight:      Height:        General: Pt is alert, awake, not in acute distress, obese Cardiovascular: RRR, S1/S2 +, no rubs, no gallops Respiratory: CTA bilaterally, no wheezing, no rhonchi Abdominal: Soft, NT, ND, bowel sounds + Extremities: no edema, no cyanosis    The results of significant diagnostics from this  hospitalization (including imaging, microbiology, ancillary and laboratory) are listed below for reference.     Microbiology: No results found for this or any previous visit (from the past 240 hour(s)).   Labs: BNP (last 3 results) No results for input(s): "BNP" in the last 8760 hours. Basic Metabolic Panel: Recent Labs  Lab 01/29/22 1552 01/31/22 0607  NA 134* 138  K 3.5 4.1  CL 101 108  CO2 26 27  GLUCOSE 136* 156*  BUN 14 8  CREATININE 0.61 0.55*  CALCIUM 9.9 9.2  MG  --  1.7   Liver Function Tests: Recent Labs  Lab 01/29/22 1552  AST 38  ALT 43  ALKPHOS 82  BILITOT 0.7  PROT 7.3  ALBUMIN 3.4*   No results for input(s): "LIPASE", "AMYLASE" in the last 168 hours. No results for input(s): "AMMONIA" in the last 168 hours. CBC: Recent Labs  Lab 01/29/22 1552 01/29/22 2127 01/30/22 0829 01/30/22 1307 01/31/22 0607  WBC 13.5*  --  9.6 12.5* 10.5  HGB 13.4 12.3* 10.9* 11.4* 8.3*  HCT 40.4 36.1* 32.6* 34.0* 24.7*  MCV 87.8  --  88.6 88.1 88.5  PLT 230  --  180 234 171   Cardiac Enzymes: No results for input(s): "CKTOTAL", "CKMB", "CKMBINDEX", "TROPONINI" in the last 168 hours. BNP: Invalid input(s): "POCBNP" CBG: Recent Labs  Lab 01/29/22 2206 01/30/22 0717 01/30/22 1625 01/30/22 2347 01/31/22 0705  GLUCAP 116* 136* 127* 126* 139*   D-Dimer No results for input(s): "DDIMER" in the last 72 hours. Hgb A1c Recent Labs    01/29/22 1552  HGBA1C 6.3*   Lipid Profile No results for input(s): "CHOL", "HDL", "LDLCALC", "TRIG", "CHOLHDL", "LDLDIRECT" in the last 72 hours. Thyroid function studies No results for input(s): "TSH", "T4TOTAL", "T3FREE", "THYROIDAB" in the last 72 hours.  Invalid input(s): "FREET3" Anemia work up No results for input(s): "VITAMINB12", "FOLATE", "FERRITIN", "TIBC", "IRON", "RETICCTPCT" in the last 72 hours. Urinalysis    Component Value Date/Time   COLORURINE AMBER (A) 09/11/2021 0940   APPEARANCEUR CLEAR 09/11/2021 0940    LABSPEC >1.046 (H) 09/11/2021 0940   PHURINE 5.0 09/11/2021 0940   GLUCOSEU NEGATIVE 09/11/2021 0940   HGBUR SMALL (A) 09/11/2021 0940   BILIRUBINUR NEGATIVE 09/11/2021 0940   KETONESUR 5 (A) 09/11/2021 0940   PROTEINUR 100 (A) 09/11/2021 0940   NITRITE NEGATIVE 09/11/2021 0940   LEUKOCYTESUR NEGATIVE 09/11/2021 0940   Sepsis Labs Recent Labs  Lab 01/29/22 1552 01/30/22 0829 01/30/22 1307 01/31/22 0607  WBC 13.5* 9.6 12.5* 10.5   Microbiology No results found for this or any previous visit (from the past 240 hour(s)).   Time coordinating discharge: 35 minutes  SIGNED:   Rodena Goldmann, DO [facility] 01/31/2022, 10:35 AM  If 7PM-7AM, please contact night-coverage [email or web address]

## 2022-01-31 NOTE — Op Note (Signed)
[facility] Patient Name: Angel Berry Procedure Date: 01/31/2022 8:19 AM MRN: [id number] Date of Birth: [birth year] Attending MD: Elon Alas. Abbey Chatters DO CSN: [id number] Age: 61 Admit Type: Inpatient Procedure:                Colonoscopy Indications:              Hematochezia, Acute post hemorrhagic anemia Providers:                Elon Alas. Abbey Chatters, DO, Hinton Rao RN, RN, Hughie Closs RN, RN Referring MD:              Medicines:                See the Anesthesia note for documentation of the                            administered medications Complications:            No immediate complications. Estimated Blood Loss:     Estimated blood loss: none. Procedure:                Pre-Anesthesia Assessment:                           - The anesthesia plan was to use monitored                            anesthesia care (MAC).                           After obtaining informed consent, the colonoscope                            was passed under direct vision. Throughout the                            procedure, the patient's blood pressure, pulse, and                            oxygen saturations were monitored continuously. The                            [id number]-HQ190L ([id number]) scope was introduced through                            the anus and advanced to the the terminal ileum,                            with identification of the appendiceal orifice and                            IC valve. The colonoscopy was performed without                            difficulty. The patient tolerated  the procedure                            well. The quality of the bowel preparation was                            evaluated using the BBPS [facility] Bowel Preparation                            Scale) with scores of: Right Colon = 2 (minor                            amount of residual staining, small fragments of                            stool and/or opaque liquid, but  mucosa seen well),                            Transverse Colon = 2 (minor amount of residual                            staining, small fragments of stool and/or opaque                            liquid, but mucosa seen well) and Left Colon = 2                            (minor amount of residual staining, small fragments                            of stool and/or opaque liquid, but mucosa seen                            well). The total BBPS score equals 6. The quality                            of the bowel preparation was fair. Scope In: 8:54:36 AM Scope Out: 9:10:31 AM Scope Withdrawal Time: 0 hours 13 minutes 31 seconds  Total Procedure Duration: 0 hours 15 minutes 55 seconds  Findings:      The perianal and digital rectal examinations were normal.      Non-bleeding internal hemorrhoids were found during endoscopy.      The terminal ileum appeared normal.      Hematin (altered blood/coffee-ground-like material) was found in the       rectum, in the sigmoid colon and in the descending colon.      A single (solitary) ulcer was found in the descending colon from       previous polypectomy. Adherant clot present, likely souorce of lower GI       bleed. Stigmata of recent bleeding were present. For hemostasis, three       hemostatic clips were successfully placed (MR conditional). There was no       bleeding at the end of the procedure.      No blood in terminal  ileum. Impression:               - Preparation of the colon was fair.                           - Non-bleeding internal hemorrhoids.                           - The examined portion of the ileum was normal.                           - Blood in the rectum, in the sigmoid colon and in                            the descending colon.                           - A single (solitary) ulcer in the descending                            colon. Clips (MR conditional) were placed.                           - No specimens  collected. Moderate Sedation:      Per Anesthesia Care Recommendation:           - Return patient to hospital ward for ongoing care.                           - Resume regular diet.                           - Likely discharge later today. Will likely have                            washout for 24-48 hours. Follow up with GI in 3-4                            months.                           - IF NEEDS MRI FOR ANY REASON IN THE NEAR FUTURE                            WILL NEED KUB PRIOR Procedure Code(s):        --- Professional ---                           [phone], Colonoscopy, flexible; with control of                            bleeding, any method Diagnosis Code(s):        --- Professional ---                           K64.8, Other hemorrhoids  K62.5, Hemorrhage of anus and rectum                           K92.2, Gastrointestinal hemorrhage, unspecified                           K63.3, Ulcer of intestine                           K92.1, Melena (includes Hematochezia)                           D62, Acute posthemorrhagic anemia CPT copyright 2019 American Medical Association. All rights reserved. The codes documented in this report are preliminary and upon coder review may  be revised to meet current compliance requirements. Elon Alas. Abbey Chatters, DO [employer] Abbey Chatters, DO 01/31/2022 9:18:33 AM This report has been signed electronically. Number of Addenda: 0

## 2022-01-31 NOTE — Transfer of Care (Signed)
Immediate Anesthesia Transfer of Care Note  Patient: Angel Berry  Procedure(s) Performed: COLONOSCOPY WITH PROPOFOL  Patient Location: PACU  Anesthesia Type:General  Level of Consciousness: awake, alert , oriented and patient cooperative  Airway & Oxygen Therapy: Patient Spontanous Breathing and Patient connected to nasal cannula oxygen  Post-op Assessment: Report given to RN and Post -op Vital signs reviewed and stable  Post vital signs: Reviewed and stable  Last Vitals:  Vitals Value Taken Time  BP 114/54 01/31/22 915  Temp 97.8 01/31/22 915  Pulse 86 01/31/22 915  Resp 16 01/31/22 915  SpO2 100 01/31/22 915    Last Pain:  Vitals:   01/31/22 0749  TempSrc:   PainSc: 0-No pain      Patients Stated Pain Goal: 0 (01/30/22 1950)  Complications: No notable events documented.

## 2022-01-31 NOTE — Interval H&P Note (Signed)
History and Physical Interval Note:  01/31/2022 8:52 AM  Angel Berry  has presented today for surgery, with the diagnosis of rectal bleeding.  The various methods of treatment have been discussed with the patient and family. After consideration of risks, benefits and other options for treatment, the patient has consented to  Procedure(s): COLONOSCOPY WITH PROPOFOL (N/A) as a surgical intervention.  The patient's history has been reviewed, patient examined, no change in status, stable for surgery.  I have reviewed the patient's chart and labs.  Questions were answered to the patient's satisfaction.     Lanelle Bal

## 2022-01-31 NOTE — Anesthesia Postprocedure Evaluation (Signed)
Anesthesia Post Note  Patient: Angel Berry  Procedure(s) Performed: COLONOSCOPY WITH PROPOFOL  Patient location during evaluation: PACU Anesthesia Type: General Level of consciousness: awake and alert and oriented Pain management: pain level controlled Vital Signs Assessment: post-procedure vital signs reviewed and stable Respiratory status: spontaneous breathing, nonlabored ventilation and respiratory function stable Cardiovascular status: blood pressure returned to baseline and stable Postop Assessment: no apparent nausea or vomiting Anesthetic complications: no   No notable events documented.   Last Vitals:  Vitals:   01/31/22 0921 01/31/22 0926  BP: (!) 116/59 (!) 113/98  Pulse:  77  Resp: 14 18  Temp:    SpO2: 100% 98%    Last Pain:  Vitals:   01/31/22 0921  TempSrc:   PainSc: 0-No pain                 Rockelle Heuerman C Irbin Fines

## 2022-02-02 LAB — CBC
HCT: 24.7 % — ABNORMAL LOW (ref 39.0–52.0)
Hemoglobin: 8.3 g/dL — ABNORMAL LOW (ref 13.0–17.0)
MCH: 29.7 pg (ref 26.0–34.0)
MCHC: 33.6 g/dL (ref 30.0–36.0)
MCV: 88.5 fL (ref 80.0–100.0)
Platelets: 171 10*3/uL (ref 150–400)
RBC: 2.79 MIL/uL — ABNORMAL LOW (ref 4.22–5.81)
RDW: 13.3 % (ref 11.5–15.5)
WBC: 10.5 10*3/uL (ref 4.0–10.5)
nRBC: 0 % (ref 0.0–0.2)

## 2022-02-03 ENCOUNTER — Other Ambulatory Visit: Payer: Self-pay | Admitting: *Deleted

## 2022-02-03 DIAGNOSIS — D649 Anemia, unspecified: Secondary | ICD-10-CM

## 2022-02-05 ENCOUNTER — Ambulatory Visit: Payer: BC Managed Care – PPO | Admitting: Family Medicine

## 2022-02-05 VITALS — BP 142/78 | HR 73 | Temp 97.2°F | Ht 67.0 in | Wt 270.0 lb

## 2022-02-05 DIAGNOSIS — D62 Acute posthemorrhagic anemia: Secondary | ICD-10-CM

## 2022-02-05 DIAGNOSIS — K922 Gastrointestinal hemorrhage, unspecified: Secondary | ICD-10-CM | POA: Diagnosis not present

## 2022-02-05 DIAGNOSIS — E119 Type 2 diabetes mellitus without complications: Secondary | ICD-10-CM

## 2022-02-05 DIAGNOSIS — R030 Elevated blood-pressure reading, without diagnosis of hypertension: Secondary | ICD-10-CM | POA: Diagnosis not present

## 2022-02-05 MED ORDER — METFORMIN HCL 500 MG PO TABS: 500.0000 mg | ORAL_TABLET | Freq: Two times a day (BID) | ORAL | 2 refills | Status: DC

## 2022-02-05 MED ORDER — SILDENAFIL CITRATE 50 MG PO TABS
50.0000 mg | ORAL_TABLET | Freq: Every day | ORAL | 1 refills | Status: AC | PRN
Start: 2022-02-05 — End: ?

## 2022-02-05 NOTE — Assessment & Plan Note (Signed)
Now resolved.  Repeating CBC today.

## 2022-02-05 NOTE — Patient Instructions (Signed)
Labs today.  Follow up in 3 months.  Take care  Dr. Breckyn Ticas  

## 2022-02-05 NOTE — Assessment & Plan Note (Signed)
BP mildly elevated today.  We will continue to monitor closely.

## 2022-02-05 NOTE — Assessment & Plan Note (Signed)
A1c at goal.  Continue metformin. 

## 2022-02-05 NOTE — Assessment & Plan Note (Signed)
CBC today.  

## 2022-02-05 NOTE — Progress Notes (Signed)
Subjective:  Patient ID: Angel Berry, male    DOB: [birth year]  Age: 61 y.o. MRN: [id number]  CC: Chief Complaint  Patient presents with   Hospitalization Follow-up    Post colonoscopy bleeding     HPI:  61 year old male with type 2 diabetes, elevated BP without a diagnosis of hypertension, morbid obesity presents for follow-up.  Patient had a recent colonoscopy.  Subsequently developed bloody stools and went to the hospital for further evaluation.  Initial hemoglobin was 13.4.  Trended down to 8.3.  He did not require transfusion.  Colonoscopy was performed and there was a solitary ulcer in the descending colon from previous polypectomy.  There was clot present and this was thought to be the source of bleeding.  3 hemostatic clips were placed and there was no further bleeding.  Patient did well during hospitalization.  Patient presents today for follow-up.  He states that overall he is feeling well.  He states he has had a slight decrease in his energy level.  This seems to be improving.  Patient needs repeat CBC.  He has not had any further rectal bleeding or bloody stools.  No abdominal pain.  Additionally, during his hospitalization, A1c was obtained and was 6.3.  He endorses compliance with metformin.  Patient Active Problem List   Diagnosis Date Noted   ABLA (acute blood loss anemia)    Acute GI bleeding 01/29/2022   Elevated BP without diagnosis of hypertension 10/12/2021   Preventative health care 10/12/2021   DM (diabetes mellitus) ([place]) 09/12/2021   Obesity, Class III, BMI 40-49.9 (morbid obesity) ([place]) 09/12/2021    Social Hx   Social History   Socioeconomic History   Marital status: Married    Spouse name: Not on file   Number of children: Not on file   Years of education: Not on file   Highest education level: Not on file  Occupational History   Not on file  Tobacco Use   Smoking status: Former    Types: Cigarettes   Smokeless tobacco: Never  Vaping  Use   Vaping Use: Never used  Substance and Sexual Activity   Alcohol use: Yes   Drug use: Not Currently   Sexual activity: Yes  Other Topics Concern   Not on file  Social History Narrative   Not on file   Social Determinants of Health   Financial Resource Strain: Not on file  Food Insecurity: Not on file  Transportation Needs: Not on file  Physical Activity: Not on file  Stress: Not on file  Social Connections: Not on file    Review of Systems Per HPI  Objective:  BP (!) 142/78   Pulse 73   Temp (!) 97.2 F (36.2 C)   Ht $R'5\' 7"'Wz$  (1.702 m)   Wt 270 lb (122.5 kg)   SpO2 98%   BMI 42.29 kg/m      02/05/2022    8:24 AM 01/31/2022    9:26 AM 01/31/2022    9:21 AM  BP/Weight  Systolic BP 350 093 818  Diastolic BP 78 98 59  Wt. (Lbs) 270    BMI 42.29 kg/m2      Physical Exam Vitals and nursing note reviewed.  Constitutional:      General: He is not in acute distress.    Appearance: Normal appearance. He is obese.  HENT:     Head: Normocephalic and atraumatic.  Cardiovascular:     Rate and Rhythm: Normal rate and regular rhythm.  Pulmonary:  Effort: Pulmonary effort is normal.     Breath sounds: Normal breath sounds. No wheezing, rhonchi or rales.  Abdominal:     General: There is no distension.     Palpations: Abdomen is soft.     Tenderness: There is no abdominal tenderness.  Neurological:     Mental Status: He is alert.     Lab Results  Component Value Date   WBC 10.5 01/31/2022   HGB 8.3 (L) 01/31/2022   HCT 24.7 (L) 01/31/2022   PLT 171 01/31/2022   GLUCOSE 156 (H) 01/31/2022   CHOL 192 10/10/2021   TRIG 112 10/10/2021   HDL 72 10/10/2021   LDLCALC 100 (H) 10/10/2021   ALT 43 01/29/2022   AST 38 01/29/2022   NA 138 01/31/2022   K 4.1 01/31/2022   CL 108 01/31/2022   CREATININE 0.55 (L) 01/31/2022   BUN 8 01/31/2022   CO2 27 01/31/2022   HGBA1C 6.3 (H) 01/29/2022     Assessment & Plan:   Problem List Items Addressed This Visit        Digestive   Acute GI bleeding    Now resolved.  Repeating CBC today.        Endocrine   DM (diabetes mellitus) (HCC) (Chronic)    A1c at goal.  Continue metformin.      Relevant Medications   metFORMIN (GLUCOPHAGE) 500 MG tablet     Other   ABLA (acute blood loss anemia) - Primary    CBC today.      Relevant Orders   CBC   Iron, TIBC and Ferritin Panel   Elevated BP without diagnosis of hypertension    BP mildly elevated today.  We will continue to monitor closely.      Relevant Orders   CMP14+EGFR    Meds ordered this encounter  Medications   sildenafil (VIAGRA) 50 MG tablet    Sig: Take 1-2 tablets (50-100 mg total) by mouth daily as needed for erectile dysfunction.    Dispense:  20 tablet    Refill:  1   metFORMIN (GLUCOPHAGE) 500 MG tablet    Sig: Take 1 tablet (500 mg total) by mouth 2 (two) times daily with a meal.    Dispense:  60 tablet    Refill:  2    Follow-up:  Return in about 3 months (around 05/08/2022).  [place]

## 2022-02-06 LAB — CMP14+EGFR
ALT: 27 IU/L (ref 0–44)
AST: 27 IU/L (ref 0–40)
Albumin/Globulin Ratio: 1.9 (ref 1.2–2.2)
Albumin: 4.1 g/dL (ref 3.9–4.9)
Alkaline Phosphatase: 85 IU/L (ref 44–121)
BUN/Creatinine Ratio: 16 (ref 10–24)
BUN: 9 mg/dL (ref 8–27)
Bilirubin Total: 0.3 mg/dL (ref 0.0–1.2)
CO2: 24 mmol/L (ref 20–29)
Calcium: 10.2 mg/dL (ref 8.6–10.2)
Chloride: 100 mmol/L (ref 96–106)
Creatinine, Ser: 0.57 mg/dL — ABNORMAL LOW (ref 0.76–1.27)
Globulin, Total: 2.2 g/dL (ref 1.5–4.5)
Glucose: 127 mg/dL — ABNORMAL HIGH (ref 70–99)
Potassium: 4.9 mmol/L (ref 3.5–5.2)
Sodium: 138 mmol/L (ref 134–144)
Total Protein: 6.3 g/dL (ref 6.0–8.5)
eGFR: 110 mL/min/{1.73_m2} (ref >59)

## 2022-02-06 LAB — IRON,TIBC AND FERRITIN PANEL
Ferritin: 103 ng/mL (ref 30–400)
Iron Saturation: 12 % — ABNORMAL LOW (ref 15–55)
Iron: 32 ug/dL — ABNORMAL LOW (ref 38–169)
Total Iron Binding Capacity: 267 ug/dL (ref 250–450)
UIBC: 235 ug/dL (ref 111–343)

## 2022-02-06 LAB — CBC
Hematocrit: 26.5 % — ABNORMAL LOW (ref 37.5–51.0)
Hemoglobin: 8.7 g/dL — ABNORMAL LOW (ref 13.0–17.7)
MCH: 29.5 pg (ref 26.6–33.0)
MCHC: 32.8 g/dL (ref 31.5–35.7)
MCV: 90 fL (ref 79–97)
Platelets: 341 10*3/uL (ref 150–450)
RBC: 2.95 x10E6/uL — ABNORMAL LOW (ref 4.14–5.80)
RDW: 13.6 % (ref 11.6–15.4)
WBC: 10.2 10*3/uL (ref 3.4–10.8)

## 2022-02-08 ENCOUNTER — Other Ambulatory Visit: Payer: Self-pay | Admitting: Family Medicine

## 2022-02-08 MED ORDER — IRON (FERROUS SULFATE) 325 (65 FE) MG PO TABS: 325.0000 mg | ORAL_TABLET | Freq: Every day | ORAL | 0 refills | Status: DC

## 2022-02-16 ENCOUNTER — Encounter (HOSPITAL_COMMUNITY): Payer: Self-pay | Admitting: Internal Medicine

## 2022-02-16 NOTE — Progress Notes (Signed)
Left message for patient to return the call for additional details and recommendations.   

## 2022-02-20 NOTE — Addendum Note (Signed)
Addended by: Margaretha Sheffield on: 02/20/2022 04:36 PM   Modules accepted: Orders

## 2022-02-25 ENCOUNTER — Ambulatory Visit (INDEPENDENT_AMBULATORY_CARE_PROVIDER_SITE_OTHER): Payer: BC Managed Care – PPO

## 2022-02-25 DIAGNOSIS — Z23 Encounter for immunization: Secondary | ICD-10-CM

## 2022-02-25 LAB — HEMOGLOBIN A1C
Est. average glucose Bld gHb Est-mCnc: 114 mg/dL
Hgb A1c MFr Bld: 5.6 % (ref 4.8–5.6)

## 2022-02-26 ENCOUNTER — Encounter: Payer: Self-pay | Admitting: Internal Medicine

## 2022-02-26 ENCOUNTER — Ambulatory Visit (INDEPENDENT_AMBULATORY_CARE_PROVIDER_SITE_OTHER): Payer: BC Managed Care – PPO | Admitting: Internal Medicine

## 2022-02-26 VITALS — BP 180/72 | HR 72 | Temp 97.3°F | Ht 67.0 in | Wt 273.0 lb

## 2022-02-26 DIAGNOSIS — K922 Gastrointestinal hemorrhage, unspecified: Secondary | ICD-10-CM

## 2022-02-26 DIAGNOSIS — Q859 Phakomatosis, unspecified: Secondary | ICD-10-CM

## 2022-02-26 DIAGNOSIS — D62 Acute posthemorrhagic anemia: Secondary | ICD-10-CM | POA: Diagnosis not present

## 2022-02-26 NOTE — Progress Notes (Signed)
Referring Provider: Coral Spikes, DO Primary Care Physician:  Coral Spikes, DO Primary GI:  Dr. Abbey Chatters  Chief Complaint  Patient presents with   Follow-up    Doing well, no issues.    HPI:   Angel Berry is a 61 y.o. male who presents to the clinic today for hospital follow-up visit.  Patient underwent screening colonoscopy 01/19/2022 with 12 mm polyp removed in the terminal ileum and 15 mm polyp removed in the descending colon both with hot snare.  Presented to [facility] 01/29/2022 with hematochezia.  Hemoglobin had dropped to as low as 8.3.  Underwent colonoscopy 01/31/2022 which showed actively bleeding ulcer at the post polypectomy site of the descending colon polyp.  3 clips were placed with good hemostasis.  Patient was discharged home.  Since his discharge, he states he has been doing well.  No further bleeding.  Denies any weakness/fatigue.  No abdominal pain.  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  Patient denies any family history of polyposis syndromes.  Past Medical History:  Diagnosis Date   Diabetes mellitus without complication [facility])     Past Surgical History:  Procedure Laterality Date   CHOLECYSTECTOMY     COLONOSCOPY WITH PROPOFOL N/A 01/19/2022   Procedure: COLONOSCOPY WITH PROPOFOL;  Surgeon: Eloise Harman, DO;  Location: [facility];  Service: Endoscopy;  Laterality: N/A;  9:15am, asa 3   COLONOSCOPY WITH PROPOFOL N/A 01/31/2022   Procedure: COLONOSCOPY WITH PROPOFOL;  Surgeon: Eloise Harman, DO;  Location: [facility];  Service: Endoscopy;  Laterality: N/A;   HEMOSTASIS CLIP PLACEMENT  01/31/2022   Procedure: HEMOSTASIS CLIP PLACEMENT;  Surgeon: Eloise Harman, DO;  Location: [facility];  Service: Endoscopy;;  Post descending colon polypectomy site bleed 3 clips applied   LAPAROSCOPIC APPENDECTOMY N/A 09/11/2021   Procedure: APPENDECTOMY  LAPAROSCOPIC;  Surgeon: Rusty Aus, DO;  Location: [facility];  Service: General;  Laterality: N/A;   POLYPECTOMY  01/19/2022   Procedure: POLYPECTOMY;  Surgeon: Eloise Harman, DO;  Location: [facility];  Service: Endoscopy;;    Current Outpatient Medications  Medication Sig Dispense Refill   blood glucose meter kit and supplies KIT Dispense based on patient and insurance preference. Use up to four times daily as directed. 1 each 0   Iron, Ferrous Sulfate, 325 (65 Fe) MG TABS Take 325 mg by mouth daily. 90 tablet 0   metFORMIN (GLUCOPHAGE) 500 MG tablet Take 1 tablet (500 mg total) by mouth 2 (two) times daily with a meal. 60 tablet 2   sildenafil (VIAGRA) 50 MG tablet Take 1-2 tablets (50-100 mg total) by mouth daily as needed for erectile dysfunction. 20 tablet 1   No current facility-administered medications for this visit.    Allergies as of 02/26/2022   (No Known Allergies)    No family history on file.  Social History   Socioeconomic History   Marital status: Married    Spouse name: Not on file   Number of children: Not on file   Years of education: Not on file   Highest education level: Not on file  Occupational History   Not on file  Tobacco Use   Smoking status: Former    Types: Cigarettes   Smokeless tobacco: Never  Vaping Use   Vaping Use: Never used  Substance and Sexual Activity   Alcohol use: Yes    Comment: occ  Drug use: Not Currently   Sexual activity: Yes  Other Topics Concern   Not on file  Social History Narrative   Not on file   Social Determinants of Health   Financial Resource Strain: Not on file  Food Insecurity: Not on file  Transportation Needs: Not on file  Physical Activity: Not on file  Stress: Not on file  Social Connections: Not on file    Subjective: Review of Systems  Constitutional:  Negative for chills and fever.  HENT:  Negative for congestion and hearing loss.   Eyes:  Negative for blurred vision and  double vision.  Respiratory:  Negative for cough and shortness of breath.   Cardiovascular:  Negative for chest pain and palpitations.  Gastrointestinal:  Negative for abdominal pain, blood in stool, constipation, diarrhea, heartburn, melena and vomiting.  Genitourinary:  Negative for dysuria and urgency.  Musculoskeletal:  Negative for joint pain and myalgias.  Skin:  Negative for itching and rash.  Neurological:  Negative for dizziness and headaches.  Psychiatric/Behavioral:  Negative for depression. The patient is not nervous/anxious.      Objective: BP (!) 180/72 (BP Location: Left Arm, Patient Position: Sitting, Cuff Size: Large)   Pulse 72   Temp (!) 97.3 F (36.3 C) (Temporal)   Ht 5' 7"  (1.702 m)   Wt 273 lb (123.8 kg)   SpO2 95%   BMI 42.76 kg/m  Physical Exam Constitutional:      Appearance: Normal appearance. He is obese.  HENT:     Head: Normocephalic and atraumatic.  Eyes:     Extraocular Movements: Extraocular movements intact.     Conjunctiva/sclera: Conjunctivae normal.  Cardiovascular:     Rate and Rhythm: Normal rate and regular rhythm.  Pulmonary:     Effort: Pulmonary effort is normal.     Breath sounds: Normal breath sounds.  Abdominal:     General: Bowel sounds are normal.     Palpations: Abdomen is soft.  Musculoskeletal:        General: Normal range of motion.     Cervical back: Normal range of motion and neck supple.  Skin:    General: Skin is warm.  Neurological:     General: No focal deficit present.     Mental Status: He is alert and oriented to person, place, and time.  Psychiatric:        Mood and Affect: Mood normal.        Behavior: Behavior normal.      Assessment: *Post polypectomy bleed *Acute blood loss anemia *Peutz-Jeghers type polyp  Plan: Patient doing much better after his recent hospitalization for post polypectomy bleed.  Notes good appetite, no weakness fatigue.  No further bleeding.  Check CBC today.  No skin  lesions on physical exam today to indicate underlying Peutz-Jeghers syndrome.  Patient denies any family history of polyposis syndromes.    We will perform STK 11 blood test today.  If positive, will discuss neck steps.  Otherwise follow-up as needed.  Repeat colonoscopy 5 years.  02/26/2022 10:04 AM   Disclaimer: This note was dictated with voice recognition software. Similar sounding words can inadvertently be transcribed and may not be corrected upon review.

## 2022-02-26 NOTE — Patient Instructions (Signed)
I am going to check blood work today at [facility].  I am going to check your blood counts to ensure that they continue to improve.  I am also going to check a genetic test to rule out a syndrome called Peutz-Jeghers given the abnormal polyp removed.  Recommend repeat colonoscopy in 5 years.  Otherwise follow-up with GI as needed.  It was very nice seeing you again today.  I am happy to hear that you are feeling much better.  Have a great time with your grandkids.  Dr. Marletta Lor

## 2022-03-22 NOTE — Telephone Encounter (Signed)
error 

## 2022-03-23 LAB — CBC
HCT: 38 % — ABNORMAL LOW (ref 38.5–50.0)
Hemoglobin: 12 g/dL — ABNORMAL LOW (ref 13.2–17.1)
MCH: 28.5 pg (ref 27.0–33.0)
MCHC: 31.6 g/dL — ABNORMAL LOW (ref 32.0–36.0)
MCV: 90.3 fL (ref 80.0–100.0)
MPV: 12.2 fL (ref 7.5–12.5)
Platelets: 231 10*3/uL (ref 140–400)
RBC: 4.21 10*6/uL (ref 4.20–5.80)
RDW: 13.4 % (ref 11.0–15.0)
WBC: 8.3 10*3/uL (ref 3.8–10.8)

## 2022-03-23 LAB — STK11 SEQUENCING
Comprehensive Interp: NEGATIVE
Interpretation Summary: NEGATIVE
STK11 Del/Dup Interp: NOT DETECTED
STK11 Del/Dup: NEGATIVE
STK11 Seq Interp: NOT DETECTED
STK11 Sequencing: NEGATIVE

## 2022-05-04 ENCOUNTER — Other Ambulatory Visit: Payer: Self-pay | Admitting: Family Medicine

## 2022-05-07 ENCOUNTER — Other Ambulatory Visit: Payer: Self-pay | Admitting: Family Medicine

## 2022-05-08 ENCOUNTER — Ambulatory Visit (INDEPENDENT_AMBULATORY_CARE_PROVIDER_SITE_OTHER): Payer: BC Managed Care – PPO | Admitting: Family Medicine

## 2022-05-08 VITALS — BP 170/86 | Temp 97.2°F | Ht 67.0 in | Wt 274.0 lb

## 2022-05-08 DIAGNOSIS — D649 Anemia, unspecified: Secondary | ICD-10-CM | POA: Insufficient documentation

## 2022-05-08 DIAGNOSIS — E7800 Pure hypercholesterolemia, unspecified: Secondary | ICD-10-CM | POA: Diagnosis not present

## 2022-05-08 DIAGNOSIS — E119 Type 2 diabetes mellitus without complications: Secondary | ICD-10-CM | POA: Diagnosis not present

## 2022-05-08 DIAGNOSIS — I10 Essential (primary) hypertension: Secondary | ICD-10-CM | POA: Insufficient documentation

## 2022-05-08 DIAGNOSIS — Z23 Encounter for immunization: Secondary | ICD-10-CM

## 2022-05-08 MED ORDER — AMLODIPINE BESYLATE 5 MG PO TABS: 5.0000 mg | ORAL_TABLET | Freq: Every day | ORAL | 3 refills | Status: DC

## 2022-05-08 NOTE — Patient Instructions (Signed)
Labs today.  Start BP medication.  Follow up in 3 months.  Take care  Dr. Adriana Simas

## 2022-05-08 NOTE — Progress Notes (Signed)
Subjective:  Patient ID: Angel Berry, male    DOB: 04-04-1961  Age: 61 y.o. MRN: [id number]  CC: Chief Complaint  Patient presents with   Diabetes    3 month follow up , has root canal sched monday    HPI:  61 year old male with a recent GI bleed after colonoscopy, type 2 diabetes, morbid obesity presents for follow-up.  Patient has been doing well on metformin.  Last A1c was in September and was 5.6.  He is doing well.   He states that he is feeling well.  No reports of bleeding.  Still taking iron.  Needs reassessment of CBC.  BP is elevated here today.  He attributes this mainly to ongoing pain that he has due to dental issue.  He is currently on medication and antibiotic therapy.  Has an upcoming root canal on Monday.  Patient desires flu shot today.  Patient Active Problem List   Diagnosis Date Noted   Anemia 05/08/2022   Essential hypertension 05/08/2022   Pure hypercholesterolemia 05/08/2022   Preventative health care 10/12/2021   DM (diabetes mellitus) (HCC) 09/12/2021   Obesity, Class III, BMI 40-49.9 (morbid obesity) (HCC) 09/12/2021    Social Hx   Social History   Socioeconomic History   Marital status: Married    Spouse name: Not on file   Number of children: Not on file   Years of education: Not on file   Highest education level: Not on file  Occupational History   Not on file  Tobacco Use   Smoking status: Former    Types: Cigarettes   Smokeless tobacco: Never  Vaping Use   Vaping Use: Never used  Substance and Sexual Activity   Alcohol use: Yes    Comment: occ   Drug use: Not Currently   Sexual activity: Yes  Other Topics Concern   Not on file  Social History Narrative   Not on file   Social Determinants of Health   Financial Resource Strain: Not on file  Food Insecurity: Not on file  Transportation Needs: Not on file  Physical Activity: Not on file  Stress: Not on file  Social Connections: Not on file    Review of Systems   Constitutional: Negative.   HENT:  Positive for dental problem.    Objective:  BP (!) 170/86   Temp (!) 97.2 F (36.2 C)   Ht 5\' 7"  (1.702 m)   Wt 274 lb (124.3 kg)   BMI 42.91 kg/m      05/08/2022    8:39 AM 05/08/2022    8:20 AM 02/26/2022    9:34 AM  BP/Weight  Systolic BP 170 168 180  Diastolic BP 86 86 72  Wt. (Lbs)  274 273  BMI  42.91 kg/m2 42.76 kg/m2    Physical Exam Vitals and nursing note reviewed.  Constitutional:      General: He is not in acute distress.    Appearance: Normal appearance. He is obese.  HENT:     Head: Normocephalic and atraumatic.  Eyes:     General:        Right eye: No discharge.        Left eye: No discharge.     Conjunctiva/sclera: Conjunctivae normal.  Cardiovascular:     Rate and Rhythm: Normal rate and regular rhythm.  Pulmonary:     Effort: Pulmonary effort is normal.     Breath sounds: Normal breath sounds. No wheezing, rhonchi or rales.  Neurological:  Mental Status: He is alert.  Psychiatric:        Mood and Affect: Mood normal.   Diabetic Foot Check -  Appearance - no lesions, ulcers or calluses Skin - no unusual pallor or redness Monofilament testing -  Right - Great toe, medial, central, lateral ball and posterior foot intact Left - Great toe, medial, central, lateral ball and posterior foot intact  Lab Results  Component Value Date   WBC 8.3 02/26/2022   HGB 12.0 (L) 02/26/2022   HCT 38.0 (L) 02/26/2022   PLT 231 02/26/2022   GLUCOSE 127 (H) 02/05/2022   CHOL 192 10/10/2021   TRIG 112 10/10/2021   HDL 72 10/10/2021   LDLCALC 100 (H) 10/10/2021   ALT 27 02/05/2022   AST 27 02/05/2022   NA 138 02/05/2022   K 4.9 02/05/2022   CL 100 02/05/2022   CREATININE 0.57 (L) 02/05/2022   BUN 9 02/05/2022   CO2 24 02/05/2022   HGBA1C 5.6 02/24/2022     Assessment & Plan:   Problem List Items Addressed This Visit       Cardiovascular and Mediastinum   Essential hypertension    BP markedly elevated here  today and on repeat.  Prior was elevated as well.  Starting on amlodipine.      Relevant Medications   amLODipine (NORVASC) 5 MG tablet     Endocrine   DM (diabetes mellitus) (Minor) - Primary (Chronic)    Well-controlled.  Continue metformin.      Relevant Orders   Microalbumin / creatinine urine ratio     Other   Pure hypercholesterolemia   Relevant Medications   amLODipine (NORVASC) 5 MG tablet   Other Relevant Orders   Lipid panel   Anemia    From prior GI bleed.  CBC and iron studies today to reassess.  Continue iron for now.      Relevant Orders   CBC   Iron, TIBC and Ferritin Panel    Meds ordered this encounter  Medications   amLODipine (NORVASC) 5 MG tablet    Sig: Take 1 tablet (5 mg total) by mouth daily.    Dispense:  90 tablet    Refill:  3    Follow-up:  3 months  [place] DO [place]

## 2022-05-08 NOTE — Assessment & Plan Note (Signed)
From prior GI bleed.  CBC and iron studies today to reassess.  Continue iron for now.

## 2022-05-08 NOTE — Assessment & Plan Note (Signed)
BP markedly elevated here today and on repeat.  Prior was elevated as well.  Starting on amlodipine.

## 2022-05-08 NOTE — Assessment & Plan Note (Signed)
Well controlled. Continue metformin.

## 2022-05-09 LAB — CBC
Hematocrit: 42.4 % (ref 37.5–51.0)
Hemoglobin: 13.8 g/dL (ref 13.0–17.7)
MCH: 27.4 pg (ref 26.6–33.0)
MCHC: 32.5 g/dL (ref 31.5–35.7)
MCV: 84 fL (ref 79–97)
Platelets: 251 10*3/uL (ref 150–450)
RBC: 5.03 x10E6/uL (ref 4.14–5.80)
RDW: 13.9 % (ref 11.6–15.4)
WBC: 10.3 10*3/uL (ref 3.4–10.8)

## 2022-05-09 LAB — IRON,TIBC AND FERRITIN PANEL
Ferritin: 90 ng/mL (ref 30–400)
Iron Saturation: 53 % (ref 15–55)
Iron: 148 ug/dL (ref 38–169)
Total Iron Binding Capacity: 279 ug/dL (ref 250–450)
UIBC: 131 ug/dL (ref 111–343)

## 2022-05-09 LAB — LIPID PANEL
Chol/HDL Ratio: 2.7 ratio (ref 0.0–5.0)
Cholesterol, Total: 183 mg/dL (ref 100–199)
HDL: 68 mg/dL (ref >39)
LDL Chol Calc (NIH): 97 mg/dL (ref 0–99)
Triglycerides: 101 mg/dL (ref 0–149)
VLDL Cholesterol Cal: 18 mg/dL (ref 5–40)

## 2022-05-09 LAB — MICROALBUMIN / CREATININE URINE RATIO
Creatinine, Urine: 65.4 mg/dL
Microalb/Creat Ratio: 12 mg/g creat (ref 0–29)
Microalbumin, Urine: 7.7 ug/mL

## 2022-08-03 ENCOUNTER — Other Ambulatory Visit: Payer: Self-pay | Admitting: Family Medicine

## 2022-08-07 ENCOUNTER — Ambulatory Visit: Payer: BC Managed Care – PPO | Admitting: Family Medicine

## 2022-08-07 DIAGNOSIS — I10 Essential (primary) hypertension: Secondary | ICD-10-CM

## 2022-08-07 DIAGNOSIS — E119 Type 2 diabetes mellitus without complications: Secondary | ICD-10-CM | POA: Diagnosis not present

## 2022-08-07 DIAGNOSIS — E7800 Pure hypercholesterolemia, unspecified: Secondary | ICD-10-CM | POA: Diagnosis not present

## 2022-08-07 MED ORDER — AMLODIPINE BESYLATE 10 MG PO TABS: 10.0000 mg | ORAL_TABLET | Freq: Every day | ORAL | 3 refills | Status: DC

## 2022-08-07 NOTE — Assessment & Plan Note (Signed)
Would like better control.  Increasing amlodipine.

## 2022-08-07 NOTE — Progress Notes (Signed)
Subjective:  Patient ID: Angel Berry, male    DOB: 1960/12/25  Age: 62 y.o. MRN: [id number]  CC: Chief Complaint  Patient presents with   Hypertension    HPI:  62 year old male with morbid obesity, hypertension, type 2 diabetes, hyperlipidemia presents for follow-up.  Blood pressure mildly elevated today initially.  Was improved on repeat.  He is compliant with amlodipine 5 mg daily.  Type 2 diabetes has been well-controlled.  Last A1c was 5.6.  Needs A1c today.  Patient on metformin.  Sometimes he forgets to take his second dose.  Lipids have been fairly well-controlled without the use of statin.  Last LDL was 97.  Patient states that he is feeling well.  He reports that he would like to lose some weight.  Patient Active Problem List   Diagnosis Date Noted   Essential hypertension 05/08/2022   Pure hypercholesterolemia 05/08/2022   Preventative health care 10/12/2021   DM (diabetes mellitus) ([place]) 09/12/2021   Obesity, Class III, BMI 40-49.9 (morbid obesity) ([place]) 09/12/2021    Social Hx   Social History   Socioeconomic History   Marital status: Married    Spouse name: Not on file   Number of children: Not on file   Years of education: Not on file   Highest education level: Not on file  Occupational History   Not on file  Tobacco Use   Smoking status: Former    Types: Cigarettes   Smokeless tobacco: Never  Vaping Use   Vaping Use: Never used  Substance and Sexual Activity   Alcohol use: Yes    Comment: occ   Drug use: Not Currently   Sexual activity: Yes  Other Topics Concern   Not on file  Social History Narrative   Not on file   Social Determinants of Health   Financial Resource Strain: Not on file  Food Insecurity: Not on file  Transportation Needs: Not on file  Physical Activity: Not on file  Stress: Not on file  Social Connections: Not on file    Review of Systems  Constitutional: Negative.   HENT:         Hard of hearing.     Objective:  BP 136/76   Pulse 80   Temp 97.9 F (36.6 C)   Ht 5' 7"$  (1.702 m)   Wt 280 lb (127 kg)   SpO2 96%   BMI 43.85 kg/m      08/07/2022    8:43 AM 08/07/2022    8:33 AM 08/07/2022    8:25 AM  BP/Weight  Systolic BP [id number]  Diastolic BP 76 82 96  Wt. (Lbs)   280  BMI   43.85 kg/m2    Physical Exam Vitals and nursing note reviewed.  Constitutional:      Appearance: Normal appearance. He is obese.  HENT:     Head: Normocephalic and atraumatic.  Cardiovascular:     Rate and Rhythm: Normal rate and regular rhythm.  Pulmonary:     Effort: Pulmonary effort is normal.     Breath sounds: Normal breath sounds. No wheezing, rhonchi or rales.  Neurological:     Mental Status: He is alert.  Psychiatric:        Mood and Affect: Mood normal.        Behavior: Behavior normal.     Lab Results  Component Value Date   WBC 10.3 05/08/2022   HGB 13.8 05/08/2022   HCT 42.4 05/08/2022   PLT 251 05/08/2022  GLUCOSE 127 (H) 02/05/2022   CHOL 183 05/08/2022   TRIG 101 05/08/2022   HDL 68 05/08/2022   LDLCALC 97 05/08/2022   ALT 27 02/05/2022   AST 27 02/05/2022   NA 138 02/05/2022   K 4.9 02/05/2022   CL 100 02/05/2022   CREATININE 0.57 (L) 02/05/2022   BUN 9 02/05/2022   CO2 24 02/05/2022   HGBA1C 5.6 02/24/2022     Assessment & Plan:   Problem List Items Addressed This Visit       Cardiovascular and Mediastinum   Essential hypertension    Would like better control.  Increasing amlodipine.      Relevant Medications   amLODipine (NORVASC) 10 MG tablet     Endocrine   DM (diabetes mellitus) (HCC) (Chronic)    A1c today.  Continue metformin for now.  If his A1c stays this well-controlled, he may be able to discontinue metformin.      Relevant Orders   Hemoglobin A1c     Other   Pure hypercholesterolemia    LDL reasonably well-controlled without the use of statin.      Relevant Medications   amLODipine (NORVASC) 10 MG tablet   Obesity,  Class III, BMI 40-49.9 (morbid obesity) ([place])    Recommend to watch diet and stay active to promote weight loss.       Meds ordered this encounter  Medications   amLODipine (NORVASC) 10 MG tablet    Sig: Take 1 tablet (10 mg total) by mouth daily.    Dispense:  90 tablet    Refill:  3    Follow-up:  6 months  [place]

## 2022-08-07 NOTE — Assessment & Plan Note (Signed)
Recommend to watch diet and stay active to promote weight loss.

## 2022-08-07 NOTE — Assessment & Plan Note (Signed)
A1c today.  Continue metformin for now.  If his A1c stays this well-controlled, he may be able to discontinue metformin.

## 2022-08-07 NOTE — Assessment & Plan Note (Signed)
LDL reasonably well-controlled without the use of statin.

## 2022-08-07 NOTE — Patient Instructions (Signed)
I have increased the amlodipine to 10 mg daily.  A1C today.  Follow up in 6 months.  Watch your diet and stay active.

## 2022-08-08 LAB — HEMOGLOBIN A1C
Est. average glucose Bld gHb Est-mCnc: 154 mg/dL
Hgb A1c MFr Bld: 7 % — ABNORMAL HIGH (ref 4.8–5.6)

## 2022-09-28 ENCOUNTER — Other Ambulatory Visit: Payer: Self-pay | Admitting: Family Medicine

## 2022-10-30 ENCOUNTER — Other Ambulatory Visit: Payer: Self-pay | Admitting: Family Medicine

## 2022-11-18 ENCOUNTER — Other Ambulatory Visit: Payer: Self-pay | Admitting: Family Medicine

## 2022-12-16 ENCOUNTER — Ambulatory Visit: Payer: BC Managed Care – PPO

## 2022-12-25 ENCOUNTER — Other Ambulatory Visit: Payer: Self-pay | Admitting: Family Medicine

## 2023-02-05 ENCOUNTER — Ambulatory Visit: Payer: BC Managed Care – PPO | Admitting: Family Medicine

## 2023-02-05 VITALS — BP 148/86 | HR 79 | Temp 97.7°F | Ht 67.0 in | Wt 265.4 lb

## 2023-02-05 DIAGNOSIS — E119 Type 2 diabetes mellitus without complications: Secondary | ICD-10-CM

## 2023-02-05 DIAGNOSIS — E7800 Pure hypercholesterolemia, unspecified: Secondary | ICD-10-CM | POA: Diagnosis not present

## 2023-02-05 DIAGNOSIS — J019 Acute sinusitis, unspecified: Secondary | ICD-10-CM

## 2023-02-05 DIAGNOSIS — I10 Essential (primary) hypertension: Secondary | ICD-10-CM | POA: Diagnosis not present

## 2023-02-05 DIAGNOSIS — Z7984 Long term (current) use of oral hypoglycemic drugs: Secondary | ICD-10-CM

## 2023-02-05 MED ORDER — SEMAGLUTIDE(0.25 OR 0.5MG/DOS) 2 MG/3ML ~~LOC~~ SOPN: PEN_INJECTOR | SUBCUTANEOUS | 0 refills | Status: DC

## 2023-02-05 MED ORDER — LOSARTAN POTASSIUM 50 MG PO TABS: 50.0000 mg | ORAL_TABLET | Freq: Every day | ORAL | 1 refills | Status: DC

## 2023-02-05 MED ORDER — DOXYCYCLINE HYCLATE 100 MG PO TABS: 100.0000 mg | ORAL_TABLET | Freq: Two times a day (BID) | ORAL | 0 refills | Status: DC

## 2023-02-05 NOTE — Patient Instructions (Signed)
Stop Amlodipine.  Start Losartan.   Labs in 7-10 days.  Antibiotic as prescribed.  Rx sent for Ozempic.

## 2023-02-07 DIAGNOSIS — J329 Chronic sinusitis, unspecified: Secondary | ICD-10-CM | POA: Insufficient documentation

## 2023-02-07 NOTE — Assessment & Plan Note (Signed)
Treating with Doxy. 

## 2023-02-07 NOTE — Assessment & Plan Note (Signed)
Lipid panel ordered today to reassess.

## 2023-02-07 NOTE — Assessment & Plan Note (Signed)
Uncontrolled. Amlodipine likely causing edema. Stopping it today. Starting Losartan. Labs ordered.

## 2023-02-07 NOTE — Progress Notes (Signed)
Subjective:  Patient ID: Angel Berry, male    DOB: 1960/09/16  Age: 62 y.o. MRN: [id number]  CC: Chief Complaint  Patient presents with   Swelling    Pt complains of his ankles swelling off and on for the past few months. No swelling today.   Nasal Congestion    HPI:  62 year old male with HTN, DM-2, HLD, Obesity presents for follow up.  Patient reports intermittent ankle edema. None at this time. Is on Amlodipine.  He also states that on Monday he developed nasal congestion. No fever. No resolution with OTC Mucinex.   BP elevated today. Currently on Norvasc 10 mg daily.  Needs labs today.   Patient interested in weight loss. Inquiring about GLP-1.   Patient Active Problem List   Diagnosis Date Noted   Sinusitis 02/07/2023   Essential hypertension 05/08/2022   Pure hypercholesterolemia 05/08/2022   Preventative health care 10/12/2021   DM (diabetes mellitus) (HCC) 09/12/2021   Obesity, Class III, BMI 40-49.9 (morbid obesity) (HCC) 09/12/2021    Social Hx   Social History   Socioeconomic History   Marital status: Married    Spouse name: Not on file   Number of children: Not on file   Years of education: Not on file   Highest education level: Not on file  Occupational History   Not on file  Tobacco Use   Smoking status: Former    Types: Cigarettes   Smokeless tobacco: Never  Vaping Use   Vaping status: Never Used  Substance and Sexual Activity   Alcohol use: Yes    Comment: occ   Drug use: Not Currently   Sexual activity: Yes  Other Topics Concern   Not on file  Social History Narrative   Not on file   Social Determinants of Health   Financial Resource Strain: Not on file  Food Insecurity: Not on file  Transportation Needs: Not on file  Physical Activity: Not on file  Stress: Not on file  Social Connections: Not on file    Review of Systems Per HPI  Objective:  BP (!) 148/86   Pulse 79   Temp 97.7 F (36.5 C)   Ht 5\' 7"  (1.702 m)    Wt 265 lb 6.4 oz (120.4 kg)   SpO2 96%   BMI 41.57 kg/m      02/05/2023    8:24 AM 08/07/2022    8:43 AM 08/07/2022    8:33 AM  BP/Weight  Systolic BP 148 136 157  Diastolic BP 86 76 82  Wt. (Lbs) 265.4    BMI 41.57 kg/m2      Physical Exam Vitals and nursing note reviewed.  Constitutional:      General: He is not in acute distress.    Appearance: Normal appearance. He is obese.  HENT:     Head: Normocephalic and atraumatic.  Eyes:     General:        Right eye: No discharge.        Left eye: No discharge.     Conjunctiva/sclera: Conjunctivae normal.  Cardiovascular:     Rate and Rhythm: Normal rate and regular rhythm.     Comments: No LE edema. Pulmonary:     Effort: Pulmonary effort is normal.     Breath sounds: Normal breath sounds. No wheezing, rhonchi or rales.  Neurological:     Mental Status: He is alert.  Psychiatric:        Mood and Affect: Mood normal.  Behavior: Behavior normal.     Lab Results  Component Value Date   WBC 10.3 05/08/2022   HGB 13.8 05/08/2022   HCT 42.4 05/08/2022   PLT 251 05/08/2022   GLUCOSE 127 (H) 02/05/2022   CHOL 183 05/08/2022   TRIG 101 05/08/2022   HDL 68 05/08/2022   LDLCALC 97 05/08/2022   ALT 27 02/05/2022   AST 27 02/05/2022   NA 138 02/05/2022   K 4.9 02/05/2022   CL 100 02/05/2022   CREATININE 0.57 (L) 02/05/2022   BUN 9 02/05/2022   CO2 24 02/05/2022   HGBA1C 7.0 (H) 08/07/2022     Assessment & Plan:   Problem List Items Addressed This Visit       Cardiovascular and Mediastinum   Essential hypertension - Primary    Uncontrolled. Amlodipine likely causing edema. Stopping it today. Starting Losartan. Labs ordered.       Relevant Medications   losartan (COZAAR) 50 MG tablet     Respiratory   Sinusitis    Treating with Doxy.      Relevant Medications   doxycycline (VIBRA-TABS) 100 MG tablet     Endocrine   DM (diabetes mellitus) (HCC) (Chronic)    Last A1c 7.0. A1c ordered. Continued  Metformin. Adding Ozempic.       Relevant Medications   Semaglutide,0.25 or 0.5MG /DOS, 2 MG/3ML SOPN   losartan (COZAAR) 50 MG tablet   Other Relevant Orders   CMP14+EGFR   Hemoglobin A1c   Microalbumin / creatinine urine ratio     Other   Pure hypercholesterolemia    Lipid panel ordered today to reassess.       Relevant Medications   losartan (COZAAR) 50 MG tablet   Other Relevant Orders   Lipid panel    Meds ordered this encounter  Medications   Semaglutide,0.25 or 0.5MG /DOS, 2 MG/3ML SOPN    Sig: 0.25 mg once weekly for 4 weeks, then increase to 0.5 mg once weekly.    Dispense:  6 mL    Refill:  0   losartan (COZAAR) 50 MG tablet    Sig: Take 1 tablet (50 mg total) by mouth daily.    Dispense:  90 tablet    Refill:  1   doxycycline (VIBRA-TABS) 100 MG tablet    Sig: Take 1 tablet (100 mg total) by mouth 2 (two) times daily.    Dispense:  14 tablet    Refill:  0    Follow-up:  Return in about 3 months (around 05/08/2023) for HTN follow up, Diabetes follow up.  Everlene Other DO [facility]

## 2023-02-07 NOTE — Assessment & Plan Note (Signed)
Last A1c 7.0. A1c ordered. Continued Metformin. Adding Ozempic.

## 2023-02-10 ENCOUNTER — Ambulatory Visit: Payer: BC Managed Care – PPO | Admitting: *Deleted

## 2023-02-10 DIAGNOSIS — E119 Type 2 diabetes mellitus without complications: Secondary | ICD-10-CM

## 2023-02-10 LAB — HM DIABETES EYE EXAM

## 2023-02-10 NOTE — Progress Notes (Signed)
Angel Berry arrived 02/10/2023 and has given verbal consent to obtain images and complete their overdue diabetic retinal screening.  The images have been sent to an ophthalmologist or optometrist for review and interpretation.  Results will be sent back to [place], DO for review.  Patient has been informed they will be contacted when we receive the results via telephone or MyChart

## 2023-03-26 ENCOUNTER — Other Ambulatory Visit: Payer: Self-pay | Admitting: Family Medicine

## 2023-03-27 ENCOUNTER — Other Ambulatory Visit: Payer: Self-pay | Admitting: Family Medicine

## 2023-05-10 ENCOUNTER — Encounter: Payer: Self-pay | Admitting: Family Medicine

## 2023-05-10 ENCOUNTER — Ambulatory Visit: Payer: BC Managed Care – PPO | Admitting: Family Medicine

## 2023-05-10 VITALS — BP 148/87 | HR 89 | Temp 97.3°F | Ht 67.0 in | Wt 260.0 lb

## 2023-05-10 DIAGNOSIS — E119 Type 2 diabetes mellitus without complications: Secondary | ICD-10-CM

## 2023-05-10 DIAGNOSIS — Z7985 Long-term (current) use of injectable non-insulin antidiabetic drugs: Secondary | ICD-10-CM

## 2023-05-10 DIAGNOSIS — I10 Essential (primary) hypertension: Secondary | ICD-10-CM | POA: Diagnosis not present

## 2023-05-10 DIAGNOSIS — E1159 Type 2 diabetes mellitus with other circulatory complications: Secondary | ICD-10-CM

## 2023-05-10 DIAGNOSIS — Z23 Encounter for immunization: Secondary | ICD-10-CM

## 2023-05-10 MED ORDER — SEMAGLUTIDE (1 MG/DOSE) 4 MG/3ML ~~LOC~~ SOPN: 1.0000 mg | PEN_INJECTOR | SUBCUTANEOUS | 1 refills | Status: DC

## 2023-05-10 NOTE — Assessment & Plan Note (Signed)
A1c today.  Increasing Ozempic.  Continue metformin.

## 2023-05-10 NOTE — Assessment & Plan Note (Signed)
Mildly elevated here today.  Continue losartan.  Needs additional weight loss.

## 2023-05-10 NOTE — Patient Instructions (Addendum)
Labs today.  Ozempic increased.  Follow up in 6 months.  Take care  Dr. Adriana Simas

## 2023-05-10 NOTE — Progress Notes (Signed)
Subjective:  Patient ID: Angel Berry, male    DOB: 1960-12-27  Age: 62 y.o. MRN: [id number]  CC:   Chief Complaint  Patient presents with   Hypertension    Coffee drinker at home 150/90 to 140/80   Diabetes    Increase to ozempic from 0.5 mg    HPI:  62 year old male with obesity, hypertension, type 2 diabetes, hyperlipidemia presents for follow-up.  Patient reports that his lower extremity edema has resolved after switch to losartan.  BP mildly elevated here.  He is compliant.  His home readings are improved compared to those here.  He states that he is tolerating Ozempic.  He has lost some weight.  He would like to discuss increasing the dose.  No reported side effects.  Needs labs today.  Patient desires his flu vaccine today.  Needs diabetic foot exam as well.  Patient Active Problem List   Diagnosis Date Noted   Essential hypertension 05/08/2022   Pure hypercholesterolemia 05/08/2022   Preventative health care 10/12/2021   DM (diabetes mellitus) (HCC) 09/12/2021   Obesity, Class III, BMI 40-49.9 (morbid obesity) (HCC) 09/12/2021    Social Hx   Social History   Socioeconomic History   Marital status: Married    Spouse name: Not on file   Number of children: Not on file   Years of education: Not on file   Highest education level: Not on file  Occupational History   Not on file  Tobacco Use   Smoking status: Former    Types: Cigarettes   Smokeless tobacco: Never  Vaping Use   Vaping status: Never Used  Substance and Sexual Activity   Alcohol use: Yes    Comment: occ   Drug use: Not Currently   Sexual activity: Yes  Other Topics Concern   Not on file  Social History Narrative   Not on file   Social Determinants of Health   Financial Resource Strain: Not on file  Food Insecurity: Not on file  Transportation Needs: Not on file  Physical Activity: Not on file  Stress: Not on file  Social Connections: Not on file    Review of Systems   Constitutional: Negative.   Respiratory: Negative.    Cardiovascular: Negative.      Objective:  BP (!) 148/87   Pulse 89   Temp (!) 97.3 F (36.3 C)   Ht 5\' 7"  (1.702 m)   Wt 260 lb (117.9 kg)   SpO2 97%   BMI 40.72 kg/m      05/10/2023   11:18 AM 05/10/2023   10:59 AM 02/05/2023    8:24 AM  BP/Weight  Systolic BP 148 153 148  Diastolic BP 87 82 86  Wt. (Lbs)  260 265.4  BMI  40.72 kg/m2 41.57 kg/m2    Physical Exam Vitals and nursing note reviewed.  Constitutional:      General: He is not in acute distress.    Appearance: Normal appearance.  HENT:     Head: Normocephalic and atraumatic.  Eyes:     General:        Right eye: No discharge.        Left eye: No discharge.     Conjunctiva/sclera: Conjunctivae normal.  Cardiovascular:     Rate and Rhythm: Normal rate and regular rhythm.  Pulmonary:     Effort: Pulmonary effort is normal.     Breath sounds: Normal breath sounds.  Neurological:     Mental Status: He is alert.  Psychiatric:        Mood and Affect: Mood normal.        Behavior: Behavior normal.     Lab Results  Component Value Date   WBC 10.3 05/08/2022   HGB 13.8 05/08/2022   HCT 42.4 05/08/2022   PLT 251 05/08/2022   GLUCOSE 127 (H) 02/05/2022   CHOL 183 05/08/2022   TRIG 101 05/08/2022   HDL 68 05/08/2022   LDLCALC 97 05/08/2022   ALT 27 02/05/2022   AST 27 02/05/2022   NA 138 02/05/2022   K 4.9 02/05/2022   CL 100 02/05/2022   CREATININE 0.57 (L) 02/05/2022   BUN 9 02/05/2022   CO2 24 02/05/2022   HGBA1C 7.0 (H) 08/07/2022     Assessment & Plan:   Problem List Items Addressed This Visit       Cardiovascular and Mediastinum   Essential hypertension    Mildly elevated here today.  Continue losartan.  Needs additional weight loss.        Endocrine   DM (diabetes mellitus) (HCC) - Primary (Chronic)    A1c today.  Increasing Ozempic.  Continue metformin.      Relevant Medications   Semaglutide, 1 MG/DOSE, 4 MG/3ML  SOPN   Other Visit Diagnoses     Immunization due       Relevant Orders   Flu vaccine trivalent PF, 6mos and older(Flulaval,Afluria,Fluarix,Fluzone) (Completed)       Meds ordered this encounter  Medications   Semaglutide, 1 MG/DOSE, 4 MG/3ML SOPN    Sig: Inject 1 mg as directed once a week.    Dispense:  3 mL    Refill:  1    Follow-up:  Return in about 6 months (around 11/07/2023) for Diabetes follow up.  Everlene Other DO [facility]

## 2023-05-11 LAB — MICROALBUMIN / CREATININE URINE RATIO
Creatinine, Urine: 47.4 mg/dL
Microalb/Creat Ratio: 12 mg/g{creat} (ref 0–29)
Microalbumin, Urine: 5.6 ug/mL

## 2023-05-11 LAB — CMP14+EGFR
ALT: 19 [IU]/L (ref 0–44)
AST: 18 [IU]/L (ref 0–40)
Albumin: 4 g/dL (ref 3.9–4.9)
Alkaline Phosphatase: 94 [IU]/L (ref 44–121)
BUN/Creatinine Ratio: 14 (ref 10–24)
BUN: 9 mg/dL (ref 8–27)
Bilirubin Total: 0.4 mg/dL (ref 0.0–1.2)
CO2: 23 mmol/L (ref 20–29)
Calcium: 10.5 mg/dL — ABNORMAL HIGH (ref 8.6–10.2)
Chloride: 100 mmol/L (ref 96–106)
Creatinine, Ser: 0.65 mg/dL — ABNORMAL LOW (ref 0.76–1.27)
Globulin, Total: 2.9 g/dL (ref 1.5–4.5)
Glucose: 94 mg/dL (ref 70–99)
Potassium: 4.6 mmol/L (ref 3.5–5.2)
Sodium: 136 mmol/L (ref 134–144)
Total Protein: 6.9 g/dL (ref 6.0–8.5)
eGFR: 105 mL/min/{1.73_m2} (ref >59)

## 2023-05-11 LAB — HEMOGLOBIN A1C
Est. average glucose Bld gHb Est-mCnc: 117 mg/dL
Hgb A1c MFr Bld: 5.7 % — ABNORMAL HIGH (ref 4.8–5.6)

## 2023-05-11 LAB — LIPID PANEL
Chol/HDL Ratio: 2.7 {ratio} (ref 0.0–5.0)
Cholesterol, Total: 162 mg/dL (ref 100–199)
HDL: 60 mg/dL (ref >39)
LDL Chol Calc (NIH): 88 mg/dL (ref 0–99)
Triglycerides: 73 mg/dL (ref 0–149)
VLDL Cholesterol Cal: 14 mg/dL (ref 5–40)

## 2023-05-16 ENCOUNTER — Other Ambulatory Visit: Payer: Self-pay | Admitting: Family Medicine

## 2023-05-16 MED ORDER — ROSUVASTATIN CALCIUM 10 MG PO TABS: 10.0000 mg | ORAL_TABLET | Freq: Every day | ORAL | 3 refills | Status: DC

## 2023-05-18 ENCOUNTER — Other Ambulatory Visit: Payer: Self-pay | Admitting: Family Medicine

## 2023-06-25 ENCOUNTER — Telehealth: Payer: Self-pay | Admitting: *Deleted

## 2023-06-25 NOTE — Telephone Encounter (Signed)
 Copied from CRM [id number]. Topic: Clinical - Medication Question >> Jun 25, 2023  3:20 PM Monisha R wrote: Reason for CRM: Patients wants to know can his dosage be up more on the next refill. For Semaglutide, 1 MG/DOSE, 4 MG/3ML.

## 2023-06-26 ENCOUNTER — Other Ambulatory Visit: Payer: Self-pay | Admitting: Family Medicine

## 2023-06-26 IMAGING — CT CT ABD-PELV W/ CM
2 of 6 series · 11 of 46 positions shown, 12 images · IV contrast (Omnipaque or Isovue)
Comparison: None
COMPARISON: None

Addendum:
CLINICAL DATA: A 60-year-old male presents with nausea, vomiting
and abdominal pain.

EXAM:
CT ABDOMEN AND PELVIS WITH CONTRAST
TECHNIQUE: Multidetector CT imaging of the abdomen and pelvis was performed
using the standard protocol following bolus administration of
intravenous contrast.

[Series 3: thins · axial · 0.91mm/px · z∈[+749,+1235]mm · 8 of 828 slices shown, 9 images]
[im 67/828  soft-tissue]
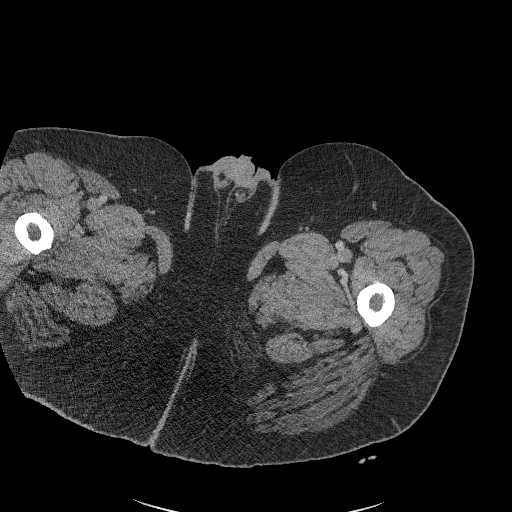
[im 67/828  bone]
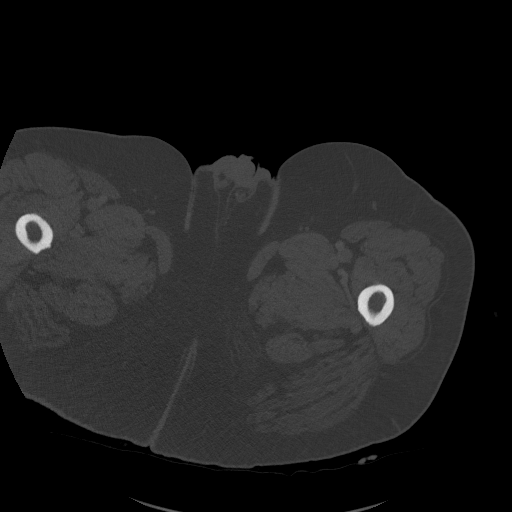
[im 166/828  soft-tissue]
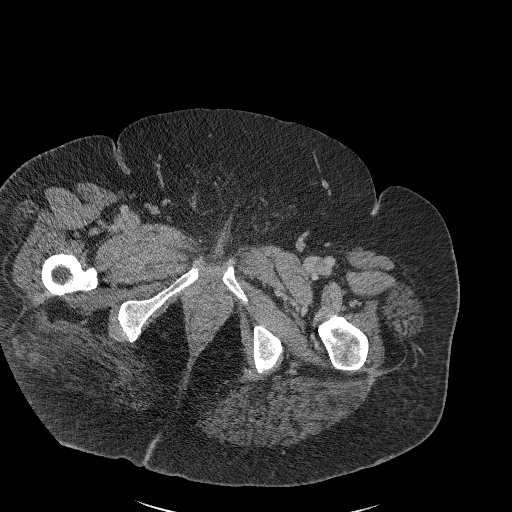
[im 265/828  soft-tissue]
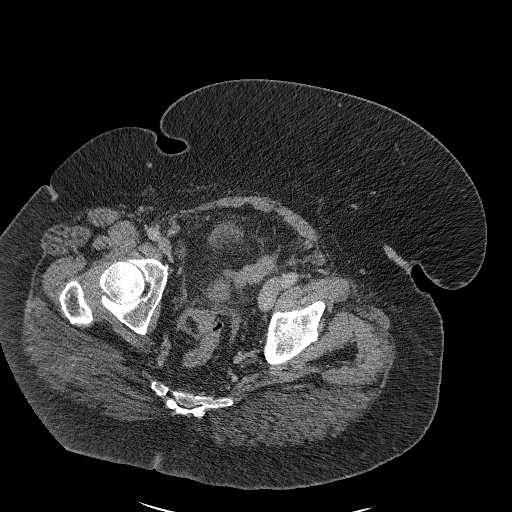
[im 364/828  soft-tissue]
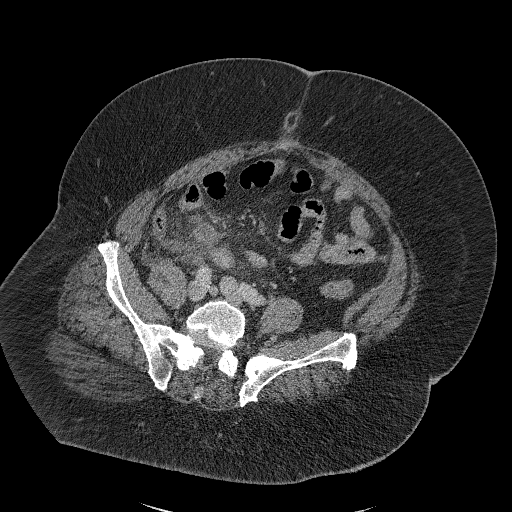
[im 464/828  soft-tissue]
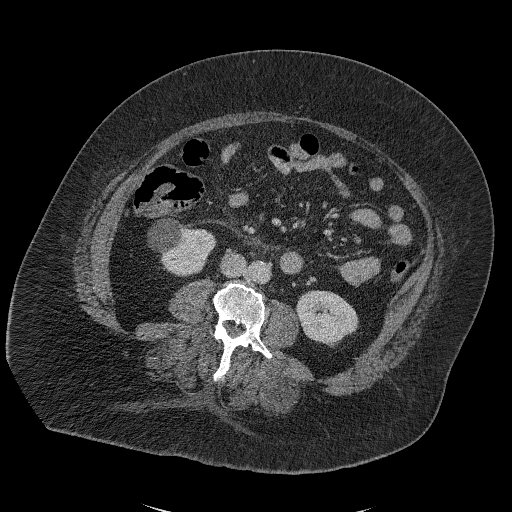
[im 563/828  soft-tissue]
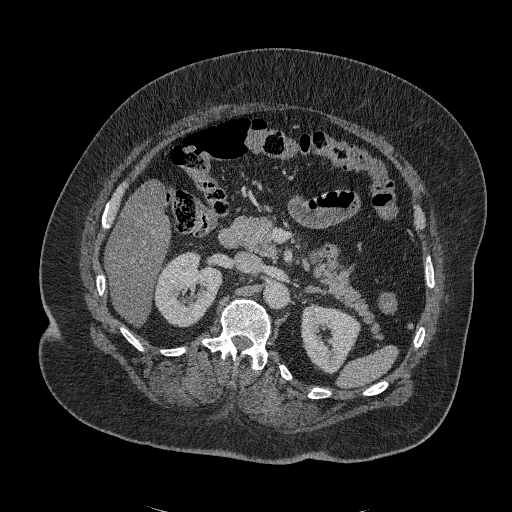
[im 662/828  soft-tissue]
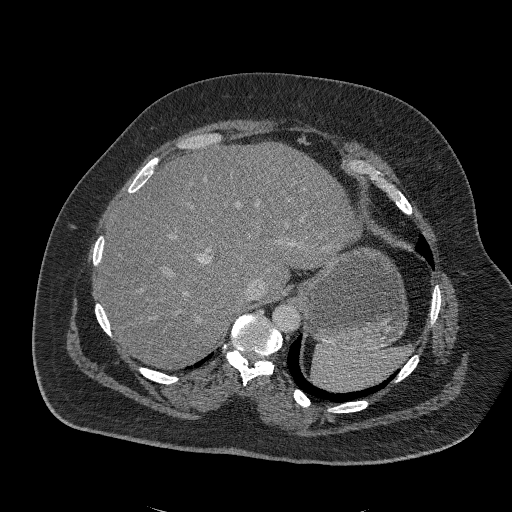
[im 761/828  soft-tissue]
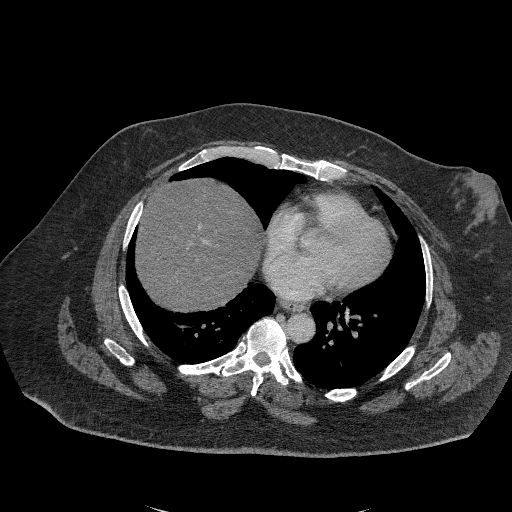

[Series 5: coronal st · coronal · 0.87mm/px · 3 of 142 slices shown]
[im 48/142  soft-tissue]
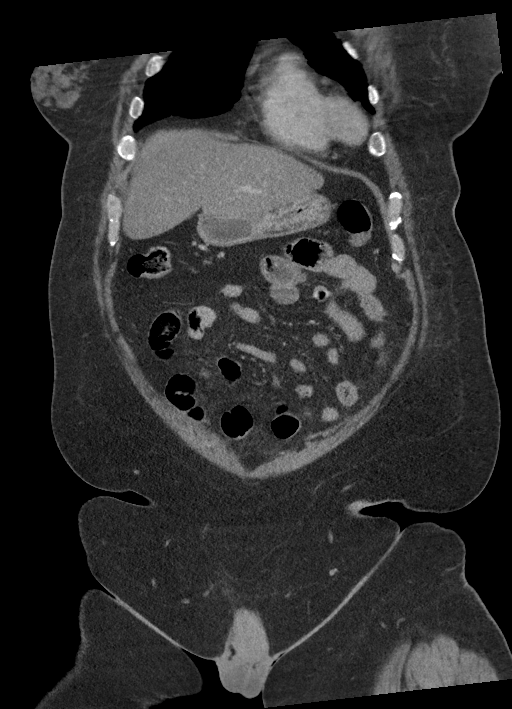
[im 63/142  soft-tissue]
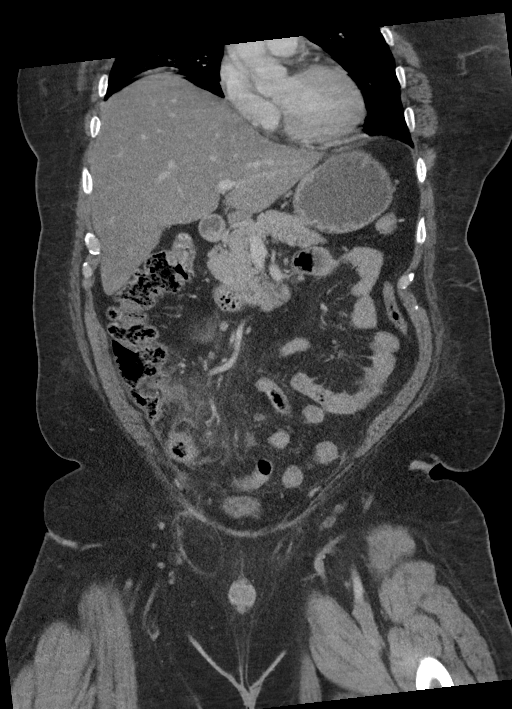
[im 79/142  soft-tissue]
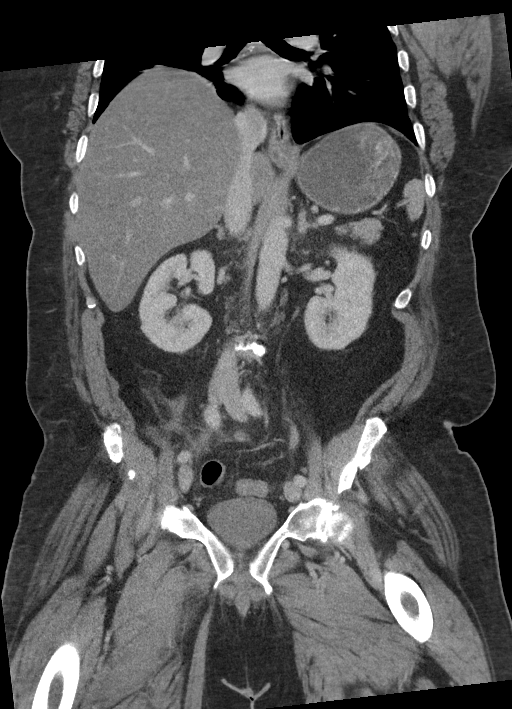

[11 of 46 positions shown; findings below may reference images not displayed]

RADIATION DOSE REDUCTION: This exam was performed according to the
departmental dose-optimization program which includes automated
exposure control, adjustment of the mA and/or kV according to
patient size and/or use of iterative reconstruction technique.

CONTRAST:  100mL OMNIPAQUE IOHEXOL 300 MG/ML  SOLN
FINDINGS: Lower chest: Incidental imaging of the lung bases with signs of
basilar atelectasis. No effusion. No consolidative changes.

Hepatobiliary: Marked hepatic steatosis with lobular hepatic
contours. Portal vein is patent. Post cholecystectomy without signs
of biliary duct distension. Hepatic veins are patent.

Pancreas: Normal, without mass, inflammation or ductal dilatation.

Spleen: Normal.

Adrenals/Urinary Tract:

Adrenal glands are unremarkable. Symmetric renal enhancement. No
sign of hydronephrosis. No suspicious renal lesion or perinephric
stranding.

Urinary bladder is grossly unremarkable.

Cyst arises from the lower pole the RIGHT kidney measuring 3 cm.

Stomach/Bowel: Signs of acute appendicitis with appendicolith at the
base of the appendix and marked appendiceal dilation measuring 17 mm
due to obstruction.

Periappendiceal fluid without focal features or peripheral
enhancement. Stranding in the adjacent small bowel mesentery and in
the RIGHT lower quadrant in general with small amount of fluid also
tracking into the pelvis. No defect present in wall enhancement of
the appendix but with mildly irregular appearance of the wall.
Secondary inflammation of the adjacent ileum.

No free air. Some stranding along the LEFT flank and in the low
abdomen towards the midline. (Image 64/2)

Vascular/Lymphatic: Portal vein is patent. SMV is patent. The normal
caliber of the abdominal aorta. Calcified atheromatous plaque of the
abdominal aorta. Smooth contour of the IVC. There is no
gastrohepatic or hepatoduodenal ligament lymphadenopathy. No
retroperitoneal or mesenteric lymphadenopathy.

No pelvic sidewall lymphadenopathy.

Reproductive: Unremarkable by CT.

Other: No pneumoperitoneum. Trace free fluid in the pelvis. Mild
stranding about LEFT lower quadrant bowel loops and in the midline
as discussed.

Musculoskeletal: No acute bone finding. No destructive bone process.
Spinal degenerative changes. Moderate 2 cm size defect in the RIGHT
inguinal region compatible with inguinal hernia containing fat.
IMPRESSION: 1. Signs of acute appendicitis, obstructed at the base by
appendicolith measuring 13 mm with smaller appendicolith near the
tip of the appendix and suspicion for perforation with generalized
inflammation in the RIGHT pelvis, cul-de-sac and in the midline and
LEFT hemiabdomen as discussed. No pneumoperitoneum or well-formed
abscess at this time.
2. Secondary inflammation of the adjacent ileum.
3. Marked hepatic steatosis with lobular hepatic contours. Correlate
with any clinical or laboratory evidence of liver disease. There is
gynecomastia which can be seen in the setting of hepatic
dysfunction.
4. Moderate fat containing RIGHT inguinal hernia.
5. Aortic atherosclerosis.

Aortic Atherosclerosis (S3I6P-44C.C).

ADDENDUM:
These results were called by telephone at the time of interpretation
on 09/11/2021 at [DATE] to provider MICAH AFONSO , who verbally
acknowledged these results.

*** End of Addendum ***
RADIATION DOSE REDUCTION: This exam was performed according to the
departmental dose-optimization program which includes automated
exposure control, adjustment of the mA and/or kV according to
patient size and/or use of iterative reconstruction technique.

CONTRAST:  100mL OMNIPAQUE IOHEXOL 300 MG/ML  SOLN
FINDINGS: Lower chest: Incidental imaging of the lung bases with signs of
basilar atelectasis. No effusion. No consolidative changes.

Hepatobiliary: Marked hepatic steatosis with lobular hepatic
contours. Portal vein is patent. Post cholecystectomy without signs
of biliary duct distension. Hepatic veins are patent.

Pancreas: Normal, without mass, inflammation or ductal dilatation.

Spleen: Normal.

Adrenals/Urinary Tract:

Adrenal glands are unremarkable. Symmetric renal enhancement. No
sign of hydronephrosis. No suspicious renal lesion or perinephric
stranding.

Urinary bladder is grossly unremarkable.

Cyst arises from the lower pole the RIGHT kidney measuring 3 cm.

Stomach/Bowel: Signs of acute appendicitis with appendicolith at the
base of the appendix and marked appendiceal dilation measuring 17 mm
due to obstruction.

Periappendiceal fluid without focal features or peripheral
enhancement. Stranding in the adjacent small bowel mesentery and in
the RIGHT lower quadrant in general with small amount of fluid also
tracking into the pelvis. No defect present in wall enhancement of
the appendix but with mildly irregular appearance of the wall.
Secondary inflammation of the adjacent ileum.

No free air. Some stranding along the LEFT flank and in the low
abdomen towards the midline. (Image 64/2)

Vascular/Lymphatic: Portal vein is patent. SMV is patent. The normal
caliber of the abdominal aorta. Calcified atheromatous plaque of the
abdominal aorta. Smooth contour of the IVC. There is no
gastrohepatic or hepatoduodenal ligament lymphadenopathy. No
retroperitoneal or mesenteric lymphadenopathy.

No pelvic sidewall lymphadenopathy.

Reproductive: Unremarkable by CT.

Other: No pneumoperitoneum. Trace free fluid in the pelvis. Mild
stranding about LEFT lower quadrant bowel loops and in the midline
as discussed.

Musculoskeletal: No acute bone finding. No destructive bone process.
Spinal degenerative changes. Moderate 2 cm size defect in the RIGHT
inguinal region compatible with inguinal hernia containing fat.
IMPRESSION: 1. Signs of acute appendicitis, obstructed at the base by
appendicolith measuring 13 mm with smaller appendicolith near the
tip of the appendix and suspicion for perforation with generalized
inflammation in the RIGHT pelvis, cul-de-sac and in the midline and
LEFT hemiabdomen as discussed. No pneumoperitoneum or well-formed
abscess at this time.
2. Secondary inflammation of the adjacent ileum.
3. Marked hepatic steatosis with lobular hepatic contours. Correlate
with any clinical or laboratory evidence of liver disease. There is
gynecomastia which can be seen in the setting of hepatic
dysfunction.
4. Moderate fat containing RIGHT inguinal hernia.
5. Aortic atherosclerosis.

Aortic Atherosclerosis (S3I6P-44C.C).

## 2023-06-26 MED ORDER — SEMAGLUTIDE (2 MG/DOSE) 8 MG/3ML ~~LOC~~ SOPN: 2.0000 mg | PEN_INJECTOR | SUBCUTANEOUS | 1 refills | Status: DC

## 2023-06-27 ENCOUNTER — Other Ambulatory Visit: Payer: Self-pay | Admitting: Family Medicine

## 2023-06-28 NOTE — Telephone Encounter (Signed)
 Tommie Sams, DO     New Rx for the 2 mg dose sent in.

## 2023-07-21 IMAGING — CT CT ABD-PELV W/ CM
2 of 5 series · 16 of 46 positions shown, 18 images · IV contrast (APPLIED)
Comparison: 09/11/2021

CLINICAL DATA: Abdominal pain. Status post laparoscopic
appendectomy on 09/11/2021. Persistent leukocytosis. Ex-smoker.

EXAM:
CT ABDOMEN AND PELVIS WITH CONTRAST
TECHNIQUE: Multidetector CT imaging of the abdomen and pelvis was performed
using the standard protocol following bolus administration of
intravenous contrast.

[Series 2: abd pel w · axial · 0.95mm/px · z∈[+969,+1419]mm · 13 of 102 slices shown, 15 images]
[im 6/102  soft-tissue]
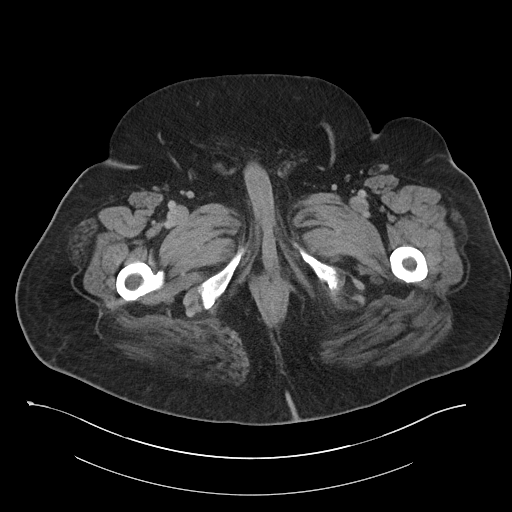
[im 6/102  bone]
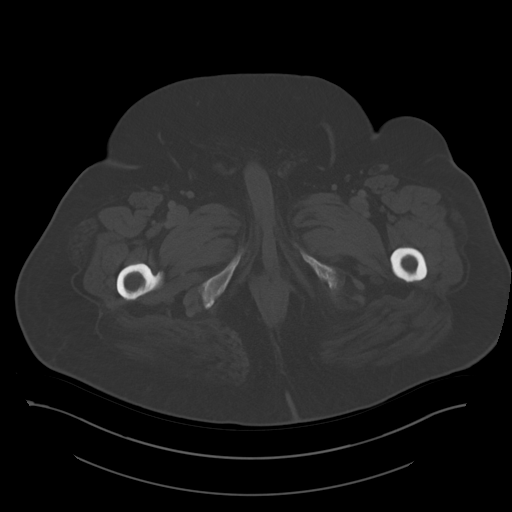
[im 12/102  soft-tissue]
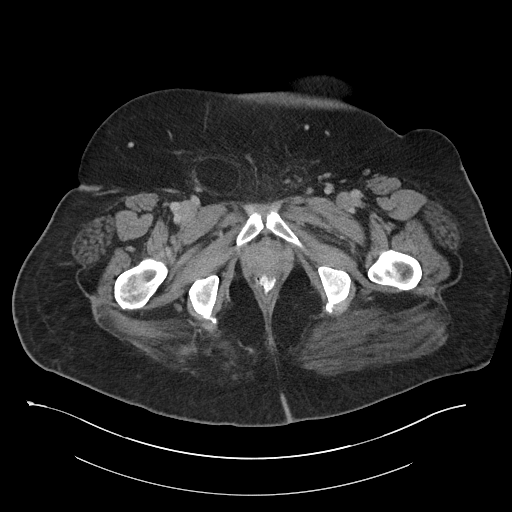
[im 23/102  soft-tissue]
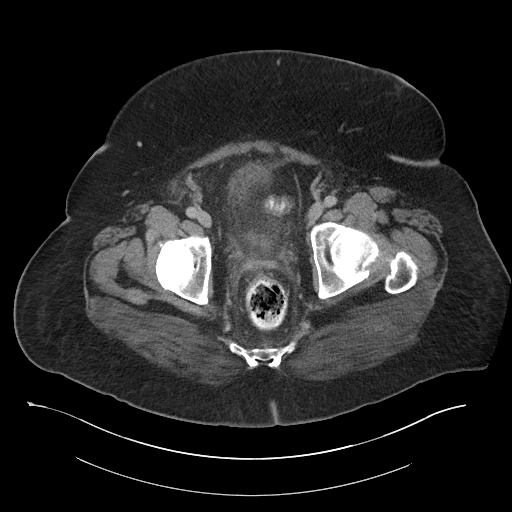
[im 29/102  soft-tissue]
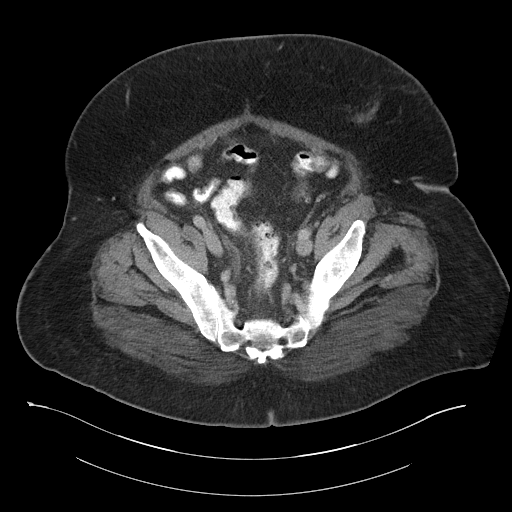
[im 34/102  soft-tissue]
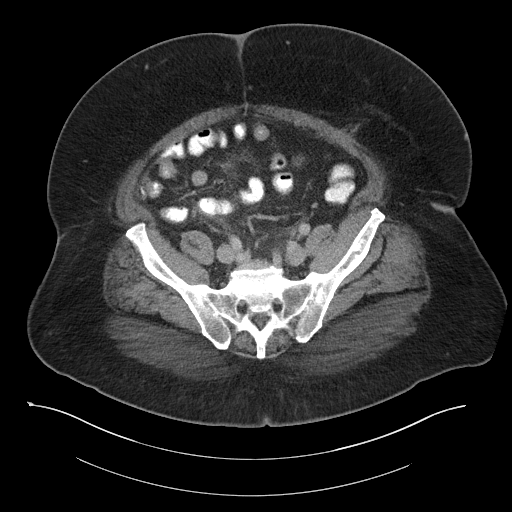
[im 45/102  soft-tissue]
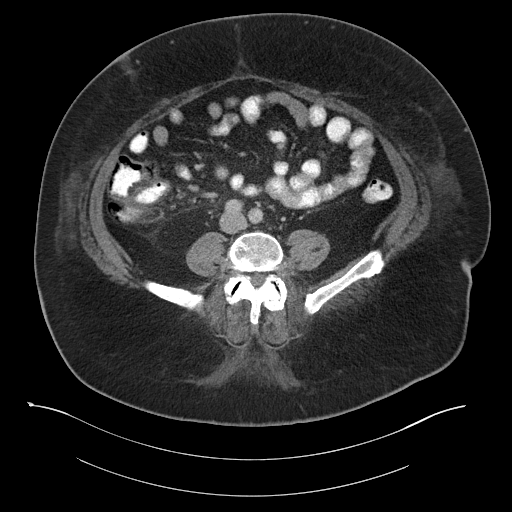
[im 51/102  soft-tissue]
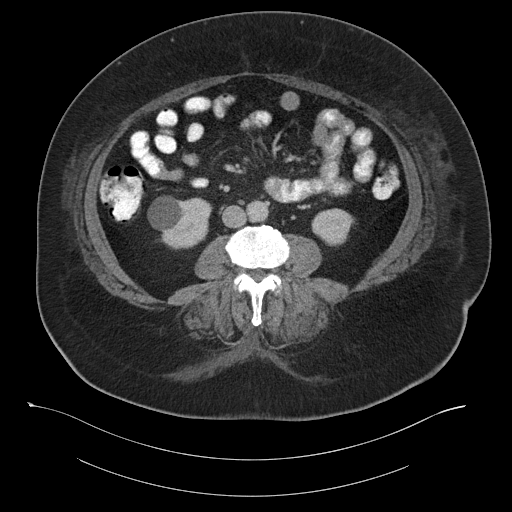
[im 57/102  soft-tissue]
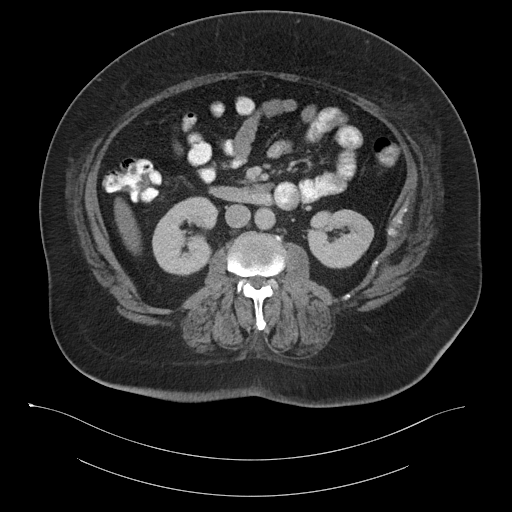
[im 68/102  soft-tissue]
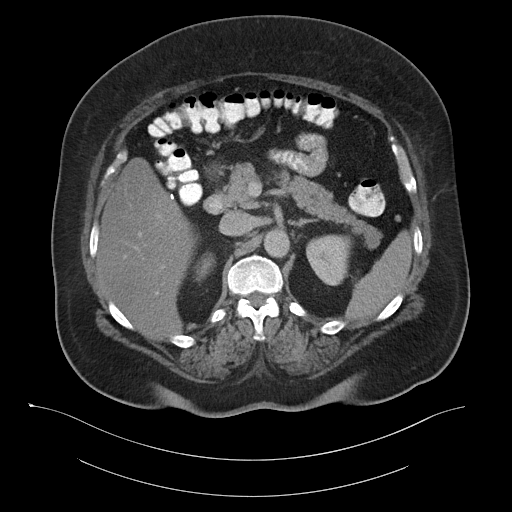
[im 68/102  bone]
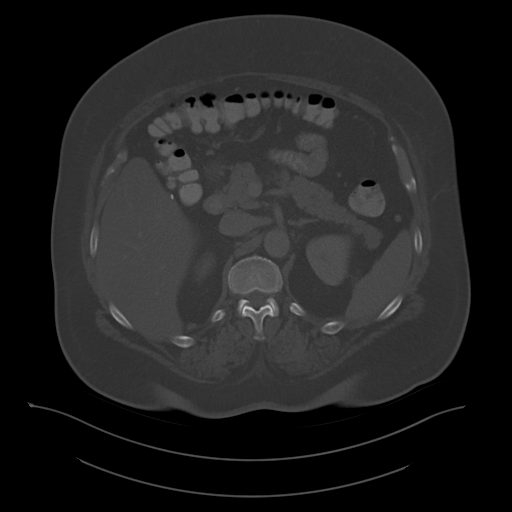
[im 73/102  soft-tissue]
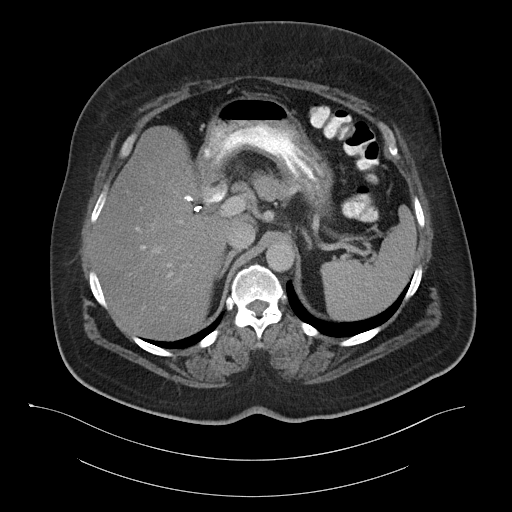
[im 79/102  soft-tissue]
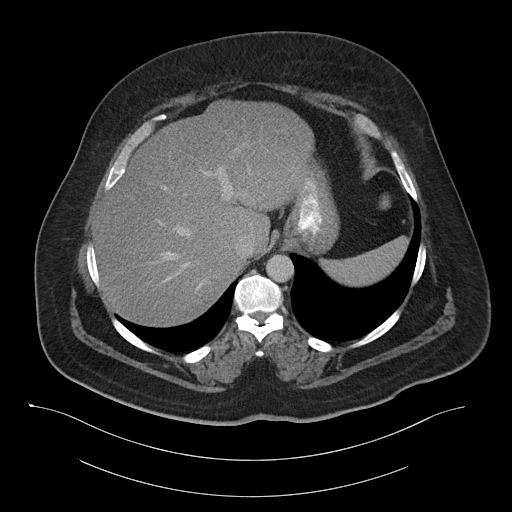
[im 90/102  soft-tissue]
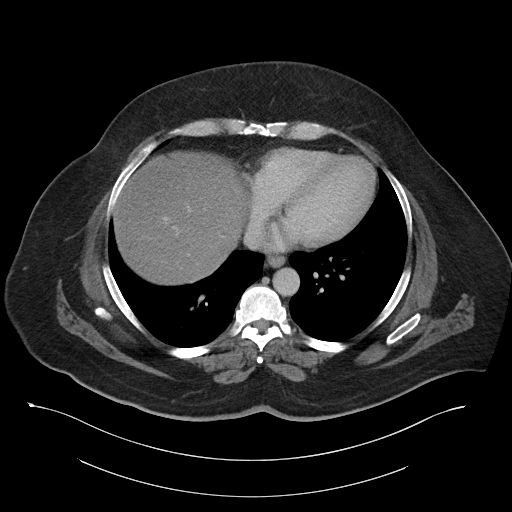
[im 96/102  soft-tissue]
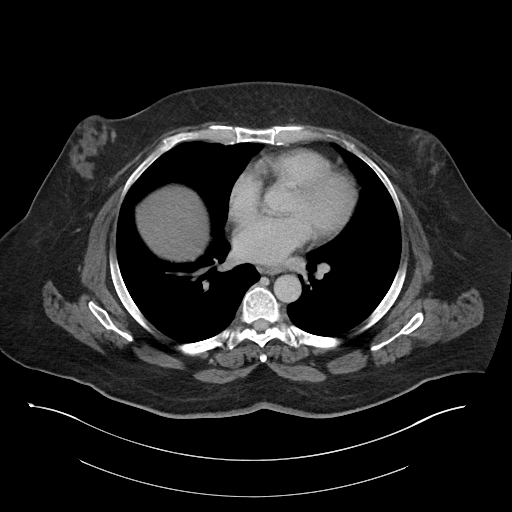

[Series 5: coronal · coronal · 0.98mm/px · 3 of 143 slices shown]
[im 48/143  soft-tissue]
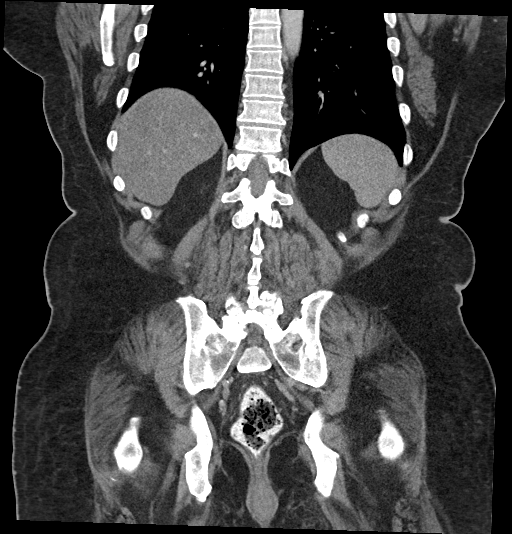
[im 64/143  soft-tissue]
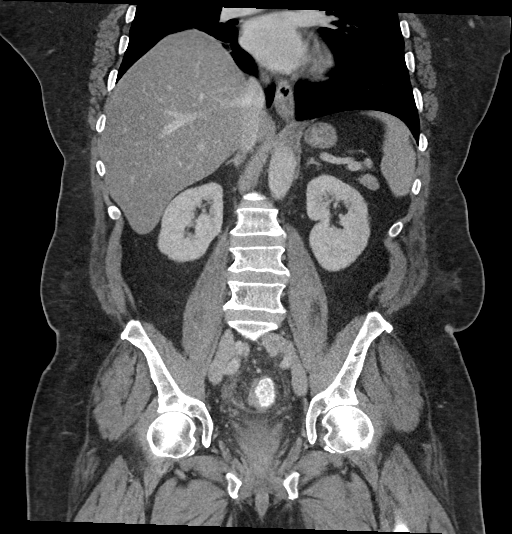
[im 79/143  soft-tissue]
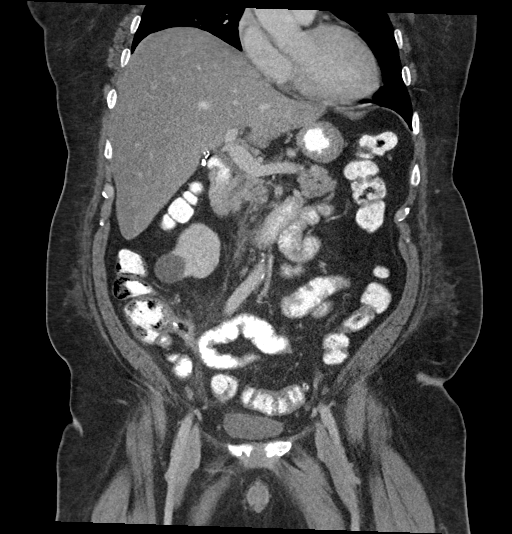

[16 of 46 positions shown; findings below may reference images not displayed]

RADIATION DOSE REDUCTION: This exam was performed according to the
departmental dose-optimization program which includes automated
exposure control, adjustment of the mA and/or kV according to
patient size and/or use of iterative reconstruction technique.

CONTRAST:  85mL OMNIPAQUE IOHEXOL 300 MG/ML  SOLN
FINDINGS: Lower chest: 4 mm nodule in the right middle lobe on image number
3/34, not well visualized on 09/11/2021 due to breathing motion
artifacts. Bilateral gynecomastia.

Hepatobiliary: Stable diffuse low density of the liver relative to
the spleen. Cholecystectomy clips.

Pancreas: Unremarkable. No pancreatic ductal dilatation or
surrounding inflammatory changes.

Spleen: Normal in size without focal abnormality.

Adrenals/Urinary Tract: Normal appearing adrenal glands. Stable
bilateral renal cysts. No urinary tract calculi or hydronephrosis.
Normal appearing urinary bladder.

Stomach/Bowel: Interval post appendectomy changes mild soft tissue
thickening at the previous location of the appendix with no fluid or
gas collection. Mild wall thickening involving the adjacent portions
of the terminal ileum and right colon. Mild soft tissue stranding in
the retroperitoneal fat in the right pelvis more inferiorly.

Unremarkable stomach.

Vascular/Lymphatic: No significant vascular findings are present. No
enlarged abdominal or pelvic lymph nodes.

Reproductive: Prostate is unremarkable.

Other: Stable moderate-sized right inguinal hernia containing fat.

Interval linear soft tissue density corresponding to a trocar tract
in the left anterior pelvic subcutaneous fat and similar area on the
right without fluid or gas collection. Interval soft tissue
thickening from the umbilicus to the anterior abdominal wall
compatible with recent endoscope placement without fluid or gas
collection.

Mild bilateral subcutaneous edema, mildly increased. Stable midline
surgical scar.

Musculoskeletal: Mild lumbar and lower thoracic spine degenerative
changes.
IMPRESSION: 1. Expected postoperative changes without abscess.
2. 4 mm right middle lobe nodule. No follow-up needed if patient is
low-risk.This recommendation follows the consensus statement:
Guidelines for Management of Incidental Pulmonary Nodules Detected
[DATE]. If the patient is high risk, dedicated chest CT with
contrast would be recommended at this time to establish a baseline
for follow-up.
3. Bilateral gynecomastia.
4. Stable moderate-sized right inguinal hernia containing fat.

## 2023-07-30 ENCOUNTER — Other Ambulatory Visit: Payer: Self-pay | Admitting: Family Medicine

## 2023-08-20 ENCOUNTER — Other Ambulatory Visit: Payer: Self-pay | Admitting: Family Medicine

## 2023-10-19 ENCOUNTER — Other Ambulatory Visit: Payer: Self-pay | Admitting: Family Medicine

## 2023-11-09 ENCOUNTER — Ambulatory Visit: Payer: BC Managed Care – PPO | Admitting: Family Medicine

## 2023-11-09 ENCOUNTER — Encounter: Payer: Self-pay | Admitting: Family Medicine

## 2023-11-09 VITALS — BP 130/84 | HR 74 | Temp 97.3°F | Ht 67.0 in | Wt 252.0 lb

## 2023-11-09 DIAGNOSIS — E7800 Pure hypercholesterolemia, unspecified: Secondary | ICD-10-CM

## 2023-11-09 DIAGNOSIS — E119 Type 2 diabetes mellitus without complications: Secondary | ICD-10-CM

## 2023-11-09 DIAGNOSIS — Z7984 Long term (current) use of oral hypoglycemic drugs: Secondary | ICD-10-CM

## 2023-11-09 DIAGNOSIS — I10 Essential (primary) hypertension: Secondary | ICD-10-CM

## 2023-11-09 DIAGNOSIS — Z130 Encounter for screening for diseases of the blood and blood-forming organs and certain disorders involving the immune mechanism: Secondary | ICD-10-CM

## 2023-11-09 DIAGNOSIS — E1159 Type 2 diabetes mellitus with other circulatory complications: Secondary | ICD-10-CM | POA: Diagnosis not present

## 2023-11-09 NOTE — Patient Instructions (Signed)
Continue your medications.  Labs today.  Follow up in 6 months.

## 2023-11-10 ENCOUNTER — Ambulatory Visit: Payer: Self-pay | Admitting: Family Medicine

## 2023-11-10 LAB — HEMOGLOBIN A1C
Est. average glucose Bld gHb Est-mCnc: 111 mg/dL
Hgb A1c MFr Bld: 5.5 % (ref 4.8–5.6)

## 2023-11-10 LAB — CBC
Hematocrit: 45.2 % (ref 37.5–51.0)
Hemoglobin: 14.2 g/dL (ref 13.0–17.7)
MCH: 28.6 pg (ref 26.6–33.0)
MCHC: 31.4 g/dL — ABNORMAL LOW (ref 31.5–35.7)
MCV: 91 fL (ref 79–97)
Platelets: 251 10*3/uL (ref 150–450)
RBC: 4.97 x10E6/uL (ref 4.14–5.80)
RDW: 13.5 % (ref 11.6–15.4)
WBC: 13.4 10*3/uL — ABNORMAL HIGH (ref 3.4–10.8)

## 2023-11-10 LAB — CMP14+EGFR
ALT: 15 IU/L (ref 0–44)
AST: 18 IU/L (ref 0–40)
Albumin: 4.2 g/dL (ref 3.9–4.9)
Alkaline Phosphatase: 95 IU/L (ref 44–121)
BUN/Creatinine Ratio: 13 (ref 10–24)
BUN: 8 mg/dL (ref 8–27)
Bilirubin Total: 0.6 mg/dL (ref 0.0–1.2)
CO2: 24 mmol/L (ref 20–29)
Calcium: 10.5 mg/dL — ABNORMAL HIGH (ref 8.6–10.2)
Chloride: 102 mmol/L (ref 96–106)
Creatinine, Ser: 0.62 mg/dL — ABNORMAL LOW (ref 0.76–1.27)
Globulin, Total: 3.1 g/dL (ref 1.5–4.5)
Glucose: 78 mg/dL (ref 70–99)
Potassium: 4.5 mmol/L (ref 3.5–5.2)
Sodium: 140 mmol/L (ref 134–144)
Total Protein: 7.3 g/dL (ref 6.0–8.5)
eGFR: 107 mL/min/{1.73_m2} (ref >59)

## 2023-11-10 LAB — LIPID PANEL
Chol/HDL Ratio: 2 ratio (ref 0.0–5.0)
Cholesterol, Total: 138 mg/dL (ref 100–199)
HDL: 69 mg/dL (ref >39)
LDL Chol Calc (NIH): 55 mg/dL (ref 0–99)
Triglycerides: 67 mg/dL (ref 0–149)
VLDL Cholesterol Cal: 14 mg/dL (ref 5–40)

## 2023-11-10 LAB — MICROALBUMIN / CREATININE URINE RATIO
Creatinine, Urine: 147 mg/dL
Microalb/Creat Ratio: 5 mg/g{creat} (ref 0–29)
Microalbumin, Urine: 8 ug/mL

## 2023-11-10 NOTE — Assessment & Plan Note (Addendum)
 A1c today.  Continue Ozempic  and Metformin . Foot exam performed today.  ** A1c return at 5.5 (well controlled).

## 2023-11-10 NOTE — Assessment & Plan Note (Signed)
 Stable. Continue losartan

## 2023-11-10 NOTE — Progress Notes (Signed)
 Subjective:  Patient ID: Angel Berry, male    DOB: 03/01/1961  Age: 63 y.o. MRN: [id number]  CC:   Chief Complaint  Patient presents with   Follow-up    6 month f/u DM , hypertension     HPI:  63 year old male presents for follow up.  HTN stable on losartan .  He is updated A1c.  Is overall doing well on semaglutide  and metformin .  Needs foot exam.  Patient's last labs revealed mild hypercalcemia.  Needs recheck today.  Denies chest pain or shortness of breath.  Patient Active Problem List   Diagnosis Date Noted   Essential hypertension 05/08/2022   Pure hypercholesterolemia 05/08/2022   Preventative health care 10/12/2021   DM (diabetes mellitus) (HCC) 09/12/2021   Obesity, Class III, BMI 40-49.9 (morbid obesity) 09/12/2021    Social Hx   Social History   Socioeconomic History   Marital status: Married    Spouse name: Not on file   Number of children: Not on file   Years of education: Not on file   Highest education level: Not on file  Occupational History   Not on file  Tobacco Use   Smoking status: Former    Types: Cigarettes   Smokeless tobacco: Never  Vaping Use   Vaping status: Never Used  Substance and Sexual Activity   Alcohol use: Yes    Comment: occ   Drug use: Not Currently   Sexual activity: Yes  Other Topics Concern   Not on file  Social History Narrative   Not on file   Social Drivers of Health   Financial Resource Strain: Not on file  Food Insecurity: Not on file  Transportation Needs: Not on file  Physical Activity: Not on file  Stress: Not on file  Social Connections: Not on file    Review of Systems Per HPI  Objective:  BP 130/84   Pulse 74   Temp (!) 97.3 F (36.3 C)   Ht 5\' 7"  (1.702 m)   Wt 252 lb (114.3 kg)   SpO2 95%   BMI 39.47 kg/m      11/09/2023   11:38 AM 11/09/2023   10:55 AM 05/10/2023   11:18 AM  BP/Weight  Systolic BP 130 143 148  Diastolic BP 84 77 87  Wt. (Lbs)  252   BMI  39.47 kg/m2      Physical Exam Vitals and nursing note reviewed.  Constitutional:      General: He is not in acute distress.    Appearance: Normal appearance.  HENT:     Head: Normocephalic and atraumatic.  Eyes:     General:        Right eye: No discharge.        Left eye: No discharge.     Conjunctiva/sclera: Conjunctivae normal.  Cardiovascular:     Rate and Rhythm: Normal rate and regular rhythm.  Pulmonary:     Effort: Pulmonary effort is normal.     Breath sounds: Normal breath sounds. No wheezing, rhonchi or rales.  Neurological:     Mental Status: He is alert.  Psychiatric:        Mood and Affect: Mood normal.        Behavior: Behavior normal.     Lab Results  Component Value Date   WBC 13.4 (H) 11/09/2023   HGB 14.2 11/09/2023   HCT 45.2 11/09/2023   PLT 251 11/09/2023   GLUCOSE 78 11/09/2023   CHOL 138 11/09/2023  TRIG 67 11/09/2023   HDL 69 11/09/2023   LDLCALC 55 11/09/2023   ALT 15 11/09/2023   AST 18 11/09/2023   NA 140 11/09/2023   K 4.5 11/09/2023   CL 102 11/09/2023   CREATININE 0.62 (L) 11/09/2023   BUN 8 11/09/2023   CO2 24 11/09/2023   HGBA1C 5.5 11/09/2023     Assessment & Plan:  Type 2 diabetes mellitus without complication, without long-term current use of insulin  (HCC) Assessment & Plan: A1c today.  Continue Ozempic  and Metformin . Foot exam performed today.  ** A1c return at 5.5 (well controlled).  Orders: -     CMP14+EGFR -     Hemoglobin A1c -     Microalbumin / creatinine urine ratio  Pure hypercholesterolemia Assessment & Plan: LDL returned at goal. Continue Crestor .  Orders: -     Lipid panel  Screening for deficiency anemia -     CBC  Essential hypertension Assessment & Plan: Stable.  Continue losartan .     Follow-up:  6 months  Doylene Splinter Debrah Fan DO [facility]

## 2023-11-10 NOTE — Assessment & Plan Note (Signed)
LDL returned at goal.  Continue Crestor.

## 2023-11-18 ENCOUNTER — Other Ambulatory Visit: Payer: Self-pay | Admitting: Family Medicine

## 2023-11-18 NOTE — Telephone Encounter (Signed)
-  Please order labs  Copied from CRM [id number]. Topic: Clinical - Lab/Test Results >> Nov 18, 2023  3:43 PM Elle L wrote: Reason for CRM: The patient returned the call regarding his lab results but had previously reviewed them on MyChart. He advised that he is agreeable to additional lab work regarding his recent calcium  results but I was unable to schedule as the orders are not in at this time.

## 2023-12-11 ENCOUNTER — Other Ambulatory Visit: Payer: Self-pay | Admitting: Family Medicine

## 2024-01-13 ENCOUNTER — Other Ambulatory Visit: Payer: Self-pay | Admitting: Family Medicine

## 2024-01-27 ENCOUNTER — Other Ambulatory Visit: Payer: Self-pay | Admitting: Family Medicine

## 2024-02-06 ENCOUNTER — Other Ambulatory Visit: Payer: Self-pay | Admitting: Family Medicine

## 2024-02-07 ENCOUNTER — Other Ambulatory Visit: Payer: Self-pay

## 2024-02-07 MED ORDER — OZEMPIC (2 MG/DOSE) 8 MG/3ML ~~LOC~~ SOPN: PEN_INJECTOR | SUBCUTANEOUS | 1 refills | Status: DC

## 2024-03-02 ENCOUNTER — Other Ambulatory Visit: Payer: Self-pay | Admitting: Family Medicine

## 2024-04-02 ENCOUNTER — Other Ambulatory Visit: Payer: Self-pay | Admitting: Family Medicine

## 2024-04-26 ENCOUNTER — Other Ambulatory Visit: Payer: Self-pay | Admitting: Family Medicine

## 2024-05-15 ENCOUNTER — Ambulatory Visit: Payer: Self-pay

## 2024-05-15 ENCOUNTER — Encounter: Payer: Self-pay | Admitting: Family Medicine

## 2024-05-15 ENCOUNTER — Ambulatory Visit: Admitting: Family Medicine

## 2024-05-15 VITALS — BP 141/90 | HR 92 | Temp 98.2°F | Ht 67.0 in | Wt 250.0 lb

## 2024-05-15 DIAGNOSIS — N6310 Unspecified lump in the right breast, unspecified quadrant: Secondary | ICD-10-CM | POA: Diagnosis not present

## 2024-05-15 DIAGNOSIS — N6452 Nipple discharge: Secondary | ICD-10-CM

## 2024-05-15 NOTE — Patient Instructions (Signed)
 Imaging ordered.  I will be in touch and so will radiology.

## 2024-05-15 NOTE — Progress Notes (Signed)
 Subjective:  Patient ID: Angel Berry, male    DOB: 1961-01-10  Age: 63 y.o. MRN: [id number]  CC:   Chief Complaint  Patient presents with   R chest  area drainage     Light in color , tenderness in right chest area    HPI:  63 year old male presents for evaluation of the above.  Patient states that he has noticed some change to the right breast but has not thought much of it.  He is unclear of the exact timeframe.  He states that yesterday he noticed discharge from his right nipple.  He states that he went on the Internet and the information he found advised him to seek care from his physician.  He does notice some tenderness to the right breast.  No reports of redness.  Patient Active Problem List   Diagnosis Date Noted   Mass of right breast 05/15/2024   Essential hypertension 05/08/2022   Pure hypercholesterolemia 05/08/2022   Preventative health care 10/12/2021   DM (diabetes mellitus) (HCC) 09/12/2021   Obesity, Class III, BMI 40-49.9 (morbid obesity) (HCC) 09/12/2021    Social Hx   Social History   Socioeconomic History   Marital status: Married    Spouse name: Not on file   Number of children: Not on file   Years of education: Not on file   Highest education level: Not on file  Occupational History   Not on file  Tobacco Use   Smoking status: Former    Types: Cigarettes   Smokeless tobacco: Never  Vaping Use   Vaping status: Never Used  Substance and Sexual Activity   Alcohol use: Yes    Comment: occ   Drug use: Not Currently   Sexual activity: Yes  Other Topics Concern   Not on file  Social History Narrative   Not on file   Social Drivers of Health   Financial Resource Strain: Not on file  Food Insecurity: Not on file  Transportation Needs: Not on file  Physical Activity: Not on file  Stress: Not on file  Social Connections: Not on file    Review of Systems Per HPI  Objective:  BP (!) 141/90   Pulse 92   Temp 98.2 F (36.8 C)   Ht  5' 7 (1.702 m)   Wt 250 lb (113.4 kg)   SpO2 99%   BMI 39.16 kg/m      05/15/2024    3:40 PM 11/09/2023   11:38 AM 11/09/2023   10:55 AM  BP/Weight  Systolic BP 141 130 143  Diastolic BP 90 84 77  Wt. (Lbs) 250  252  BMI 39.16 kg/m2  39.47 kg/m2    Physical Exam Vitals and nursing note reviewed.  Constitutional:      Appearance: He is obese.  Pulmonary:     Effort: Pulmonary effort is normal. No respiratory distress.  Chest:     Comments: Swelling noted around the right nipple.  There is a palpable firm mass directly from the nipple.  There are also some scattered firm areas to palpation.  No appreciable erythema.  No warmth.  Nipple discharge noted with gentle compression. Neurological:     Mental Status: He is alert.  Psychiatric:        Mood and Affect: Mood normal.        Behavior: Behavior normal.     Lab Results  Component Value Date   WBC 13.4 (H) 11/09/2023   HGB 14.2 11/09/2023   HCT  45.2 11/09/2023   PLT 251 11/09/2023   GLUCOSE 78 11/09/2023   CHOL 138 11/09/2023   TRIG 67 11/09/2023   HDL 69 11/09/2023   LDLCALC 55 11/09/2023   ALT 15 11/09/2023   AST 18 11/09/2023   NA 140 11/09/2023   K 4.5 11/09/2023   CL 102 11/09/2023   CREATININE 0.62 (L) 11/09/2023   BUN 8 11/09/2023   CO2 24 11/09/2023   HGBA1C 5.5 11/09/2023     Assessment & Plan:  Mass of right breast, unspecified quadrant Assessment & Plan: Etiology and prognosis unclear at this time.  Patient's presentation and exam are concerning for breast cancer.  Needs urgent mammogram and ultrasound.  Orders: -     MM 3D DIAGNOSTIC MAMMOGRAM UNILATERAL RIGHT BREAST -     US  BREAST COMPLETE UNI RIGHT INC AXILLA  Nipple discharge -     MM 3D DIAGNOSTIC MAMMOGRAM UNILATERAL RIGHT BREAST -     US  BREAST COMPLETE UNI RIGHT INC AXILLA    Follow-up: Pending workup  30 minutes were spent during this encounter taking history, examining patient, discussing plan of care, and arranging  imaging.  Jacqulyn Ahle DO [facility]

## 2024-05-15 NOTE — Telephone Encounter (Signed)
 FYI Only or Action Required?: FYI only for provider: appointment scheduled on this afternoon.  Patient was last seen in primary care on 11/09/2023 by Cook, Jayce G, DO.  Called Nurse Triage reporting lump on chest draining pus  Symptoms began yesterday. Pt states that are has been swollen for a bit - started draining yesterday after shower  Interventions attempted: Nothing.  Symptoms are: stable.  Triage Disposition: See Physician Within 24 Hours  Patient/caregiver understands and will follow disposition?: Yes                      Copied from CRM #[id number]. Topic: Clinical - Red Word Triage >> May 15, 2024  8:48 AM Ahlexyia S wrote: Red Word that prompted transfer to Nurse Triage: Pt called in stating that he think he has an infection on left side of his chest. Pt stated that he seen puss like discharge coming out near the nipple area (milky white color). Pt didn't mention any chest pain or any severe pain at this time. Pt mentioned that it is more swollen than his right side of his chest. Warm transferred to nurse triage. Reason for Disposition  [1] Boil > 1/2 inch across (> 12 mm; larger than a marble) AND [2] center is soft or pus colored  Answer Assessment - Initial Assessment Questions 1. APPEARANCE of BOIL: What does the boil look like?      Swelling on right side of chest 2. LOCATION: Where is the boil located?      Right side of chest 3. NUMBER: How many boils are there?      1 4. SIZE: How big is the boil? (e.g., inches, cm; compare to size of a coin or other object)     Very small 5. ONSET: When did the boil start?     unsure 6. PAIN: Is there any pain? If Yes, ask: How bad is the pain?   (Scale 1-10; or mild, moderate, severe)     no 7. FEVER: Do you have a fever? If Yes, ask: What is it, how was it measured, and when did it start?      no 8. SOURCE: Have you been around anyone with boils or other Staph infections? Have you ever  had boils before?     no 9. OTHER SYMPTOMS: Do you have any other symptoms? (e.g., shaking chills, weakness, rash elsewhere on body)     Feels warm, pus draining  Protocols used: Boil (Skin Abscess)-A-AH

## 2024-05-15 NOTE — Assessment & Plan Note (Signed)
 Etiology and prognosis unclear at this time.  Patient's presentation and exam are concerning for breast cancer.  Needs urgent mammogram and ultrasound.

## 2024-05-16 ENCOUNTER — Ambulatory Visit
Admission: RE | Admit: 2024-05-16 | Discharge: 2024-05-16 | Disposition: A | Discharge status: Home / Self Care | Source: Ambulatory Visit | Attending: Family Medicine | Admitting: Family Medicine

## 2024-05-16 ENCOUNTER — Other Ambulatory Visit: Payer: Self-pay | Admitting: Family Medicine

## 2024-05-16 ENCOUNTER — Ambulatory Visit
Admission: RE | Admit: 2024-05-16 | Discharge: 2024-05-16 | Disposition: A | Source: Ambulatory Visit | Attending: Family Medicine | Admitting: Family Medicine

## 2024-05-16 DIAGNOSIS — N6310 Unspecified lump in the right breast, unspecified quadrant: Secondary | ICD-10-CM

## 2024-05-16 DIAGNOSIS — N6452 Nipple discharge: Secondary | ICD-10-CM

## 2024-05-26 ENCOUNTER — Ambulatory Visit
Admission: RE | Admit: 2024-05-26 | Discharge: 2024-05-26 | Disposition: A | Source: Ambulatory Visit | Attending: Family Medicine | Admitting: Family Medicine

## 2024-05-26 DIAGNOSIS — N6452 Nipple discharge: Secondary | ICD-10-CM

## 2024-05-26 DIAGNOSIS — N6310 Unspecified lump in the right breast, unspecified quadrant: Secondary | ICD-10-CM

## 2024-05-26 HISTORY — PX: BREAST BIOPSY: SHX20

## 2024-05-29 ENCOUNTER — Other Ambulatory Visit: Payer: Self-pay | Admitting: Family Medicine

## 2024-05-29 DIAGNOSIS — N62 Hypertrophy of breast: Secondary | ICD-10-CM

## 2024-05-29 LAB — SURGICAL PATHOLOGY

## 2024-07-04 ENCOUNTER — Ambulatory Visit

## 2024-07-04 DIAGNOSIS — Z6839 Body mass index (BMI) 39.0-39.9, adult: Secondary | ICD-10-CM

## 2024-07-04 DIAGNOSIS — N62 Hypertrophy of breast: Secondary | ICD-10-CM

## 2024-07-04 DIAGNOSIS — E119 Type 2 diabetes mellitus without complications: Secondary | ICD-10-CM | POA: Diagnosis not present

## 2024-07-04 DIAGNOSIS — E7800 Pure hypercholesterolemia, unspecified: Secondary | ICD-10-CM

## 2024-07-04 DIAGNOSIS — I10 Essential (primary) hypertension: Secondary | ICD-10-CM

## 2024-07-04 NOTE — Progress Notes (Signed)
 "  New Patient Office Visit  Subjective    Patient ID: Angel Berry, male    DOB: 1960-07-07  Age: 64 y.o. MRN: [id number]  CC:  Chief Complaint  Patient presents with   Consult    HPI Angel Berry presents to establish care This is a 64 y.o. male with PMH and PSH as presented below who presents for consultation for bilateral gynecomastia, worse on the right than the left.  He was worked up by his primary care physician was noticed to have fibrocystic changes of the right breast on biopsy.  No concerning findings on mammogram or ultrasound.  Patient describes some milky discharge from the right breast sporadically.  He also describes moderate discomfort mainly on the right breast.   Body mass index is 39.78 kg/m..  Currently taking weight loss medication.  No other weight loss strategies. No known nutritional issues. No hernias. Other abdominal surgeries include open cholecystectomy. Does not smoke (former). Functional status is good.  Denies endocrinology evaluation for gynecomastia.    Outpatient Encounter Medications as of 07/04/2024  Medication Sig   acetaminophen  (TYLENOL ) 325 MG tablet Take 650 mg by mouth every 6 (six) hours as needed.   blood glucose meter kit and supplies KIT Dispense based on patient and insurance preference. Use up to four times daily as directed.   losartan  (COZAAR ) 50 MG tablet TAKE 1 TABLET BY MOUTH EVERY DAY   metFORMIN  (GLUCOPHAGE ) 500 MG tablet TAKE 1 TABLET BY MOUTH TWICE A DAY WITH FOOD   rosuvastatin  (CRESTOR ) 10 MG tablet TAKE 1 TABLET BY MOUTH EVERY DAY   Semaglutide , 2 MG/DOSE, (OZEMPIC , 2 MG/DOSE,) 8 MG/3ML SOPN INJECT 2MG  INTO THE SKIN ONCE A WEEK   sildenafil  (VIAGRA ) 50 MG tablet Take 1-2 tablets (50-100 mg total) by mouth daily as needed for erectile dysfunction. (Patient not taking: Reported on 07/04/2024)   No facility-administered encounter medications on file as of 07/04/2024.    Past Medical History:  Diagnosis Date   Diabetes  mellitus without complication [facility])     Past Surgical History:  Procedure Laterality Date   BREAST BIOPSY Right 05/26/2024   US  RT BREAST BX W LOC DEV 1ST LESION IMG BX SPEC US  GUIDE 05/26/2024 [facility] MAMMOGRAPHY   CHOLECYSTECTOMY     COLONOSCOPY WITH PROPOFOL  N/A 01/19/2022   Procedure: COLONOSCOPY WITH PROPOFOL ;  Surgeon: Cindie Carlin POUR, DO;  Location: [facility];  Service: Endoscopy;  Laterality: N/A;  9:15am, asa 3   COLONOSCOPY WITH PROPOFOL  N/A 01/31/2022   Procedure: COLONOSCOPY WITH PROPOFOL ;  Surgeon: Cindie Carlin POUR, DO;  Location: [facility];  Service: Endoscopy;  Laterality: N/A;   HEMOSTASIS CLIP PLACEMENT  01/31/2022   Procedure: HEMOSTASIS CLIP PLACEMENT;  Surgeon: Cindie Carlin POUR, DO;  Location: [facility];  Service: Endoscopy;;  Post descending colon polypectomy site bleed 3 clips applied   LAPAROSCOPIC APPENDECTOMY N/A 09/11/2021   Procedure: APPENDECTOMY LAPAROSCOPIC;  Surgeon: Evonnie Dorothyann LABOR, DO;  Location: [facility] ORS;  Service: General;  Laterality: N/A;   POLYPECTOMY  01/19/2022   Procedure: POLYPECTOMY;  Surgeon: Cindie Carlin POUR, DO;  Location: [facility];  Service: Endoscopy;;    No family history on file.  Social History   Socioeconomic History   Marital status: Married    Spouse name: Not on file   Number of children: Not on file   Years of education: Not on file   Highest education level: Not on file  Occupational History   Not on file  Tobacco Use   Smoking status: Former    Types: Cigarettes   Smokeless tobacco: Never  Vaping Use   Vaping status: Never Used  Substance and Sexual Activity   Alcohol use: Yes    Comment: occ   Drug use: Not Currently   Sexual activity: Yes  Other Topics Concern   Not on file  Social History Narrative   Not on file   Social Drivers of Health   Tobacco Use: Medium Risk (07/04/2024)   Patient History    Smoking Tobacco Use: Former    Smokeless Tobacco Use: Never    Passive Exposure: Not on  Actuary Strain: Not on file  Food Insecurity: Not on file  Transportation Needs: Not on file  Physical Activity: Not on file  Stress: Not on file  Social Connections: Not on file  Intimate Partner Violence: Not on file  Depression (PHQ2-9): Low Risk (11/09/2023)   Depression (PHQ2-9)    PHQ-2 Score: 0  Alcohol Screen: Not on file  Housing: Not on file  Utilities: Not on file  Health Literacy: Not on file    ROS ROS negative except as noted in HPI      Objective    BP (!) 177/77 (BP Location: Left Arm, Patient Position: Sitting, Cuff Size: Large)   Pulse 76   Ht 5' 7 (1.702 m)   Wt 254 lb (115.2 kg)   SpO2 96%   BMI 39.78 kg/m   Physical Exam MA as chaperone Patient alert oriented x 3 Breathing without difficulty, no respiratory effort Hemodynamically normal Bilateral gynecomastia noticed.  Right breast is larger than the left.  Grade 3 ptosis bilaterally.  Fibrocystic changes of both breasts on palpation. Testicular exam deferred today.  REPORT OF SURGICAL PATHOLOGY    Accession #: [id number]  Patient Name: Angel Berry  Visit # : [id number]   MRN: [id number]  Physician: Katha Callas  DOB/Age [birth year] (Age: 63) Gender: M  Collected Date: 05/26/2024  Received Date: 05/26/2024   FINAL DIAGNOSIS        1. Breast, right, needle core biopsy, retroareolar- dilated ducts with debris/mass (ribbon clip) :       BENIGN BREAST SHOWING MULTIPLE FRAGMENTS OF CYST WALL WITH ASSOCIATED ACUTE AND       CHRONIC INFLAMMATION       FOCAL DUCT ECTASIA       FIBROCYSTIC CHANGES INCLUDING STROMAL FIBROSIS, CYSTIC DILATATION OF DUCTS AND       FOCAL USUAL DUCT HYPERPLASIA       NEGATIVE FOR MICROCALCIFICATIONS       NEGATIVE FOR ATYPIA AND CARCINOMA   Assessment & Plan:   Problem List Items Addressed This Visit       Other   Pure hypercholesterolemia   Relevant Orders   Amb Ref to Medical Weight Management   Other Visit Diagnoses        Morbidly obese (HCC)    -  Primary   Relevant Orders   Amb Ref to Medical Weight Management     Gynecomastia, male       Relevant Orders   Amb Ref to Medical Weight Management   Ambulatory referral to Endocrinology     Diabetes mellitus without complication (HCC)       Relevant Orders   Amb Ref to Medical Weight Management     Hypertension, unspecified type       Relevant Orders   Amb Ref to Medical Weight Management      This  is a 64 y.o. male year-old man presenting for consultation for gynecomastia surgery in setting of macromastia, worse on the right.  Underwent excisional biopsy of the right breast that showed fibrocystic changes no malignancy.   We discussed the operation in detail including the potential incision patterns, and possible need for surgical drains and free nipple grafts. Based on her clinical exam, she will likely require bilateral mastectomies with FNG and liposuction of trunk. We discussed the risks of this procedure which include but are not limited to: bleeding, infection, seroma, delayed wound healing, wound dehiscence, asymmetries, fat necrosis, hypertrophic and keloid scarring, decreased or loss of nipple sensation, partial or full loss of the NAC, numbness, paresthesias, injuries to arteries/nerves/veins, need for revision procedures, further out-of-pocket expenses for ongoing medical care, and potential need for repeat surgery in the future. We further discussed the risk of DVT/PE, fat embolism, heart attack, stroke, death as well as the risks of anesthesia. We reviewed the expected recovery period with no heavy activities or lifting >5lbs for 6 weeks postoperatively. We discuss the need to stop smoking 6 weeks before surgery and 6 weeks after. We also discussed marihuana as one of the reasons that can worsen gynecomastia. Patient expressed understanding.    He additionally will require clearance from Endocrinology in order to optimize him safety perioperatively. The  patient voiced understanding throughout our discussion today. All questions and concerns were addressed to the patient's apparent satisfaction.    Plan: - Patient wants to wait to schedule bilateral mastectomies with free nipple graft with liposuction of trunk - Referral to healthy weight and wellness. - Referred back to PCP to manage hypertension. - Needs to see endocrinologist for clearance (including testicular exam). The labs that I recommend are Total testosterone, Free testosterone, Estradiol, LH, FSH, Prolactin, Human chorionic gonadotropin (hCG), Dehydroepiandrosterone sulfate (DHEAS), TSH, T3/T4, Liver enzymes -Patient will follow-up with me in 3 months.   The sensitive parts of the examination/procedure were performed with MA as chaperone.   The time documented represents the total time spent on the day of the encounter in preparing for and completing the visit. It does not include time spent by ancillary staff, a resident, a fellow, another trainee, or, for shared visits, time spent jointly with the patient or discussing the case or the performance of other separately performed services.   Time spent: 45 minutes.      Maryela Tapper, MD [facility]   Nieves CHRISTELLA Client, MD   "

## 2024-07-07 ENCOUNTER — Other Ambulatory Visit: Payer: Self-pay | Admitting: Family Medicine

## 2024-07-22 ENCOUNTER — Other Ambulatory Visit: Payer: Self-pay | Admitting: Family Medicine

## 2024-10-04 ENCOUNTER — Ambulatory Visit
